# Patient Record
Sex: Male | Born: 1950 | Race: White | Hispanic: No | Marital: Single | Smoking: Never smoker
Health system: Southern US, Community
[De-identification: ages and names within clinical notes are randomized; demographics above are authoritative.]

## PROBLEM LIST (undated history)

## (undated) DIAGNOSIS — D496 Neoplasm of unspecified behavior of brain: Secondary | ICD-10-CM

## (undated) DIAGNOSIS — S069XAA Unspecified intracranial injury with loss of consciousness status unknown, initial encounter: Secondary | ICD-10-CM

## (undated) DIAGNOSIS — S069X9A Unspecified intracranial injury with loss of consciousness of unspecified duration, initial encounter: Secondary | ICD-10-CM

## (undated) DIAGNOSIS — D2360 Other benign neoplasm of skin of unspecified upper limb, including shoulder: Secondary | ICD-10-CM

## (undated) DIAGNOSIS — D126 Benign neoplasm of colon, unspecified: Secondary | ICD-10-CM

## (undated) DIAGNOSIS — C44519 Basal cell carcinoma of skin of other part of trunk: Secondary | ICD-10-CM

---

## 2008-09-07 LAB — GLUCOSE, POC: Glucose (POC): 141 mg/dL — ABNORMAL HIGH (ref 70–110)

## 2014-01-11 NOTE — Other (Signed)
Pt denies sleep apnea

## 2014-01-12 ENCOUNTER — Inpatient Hospital Stay: Payer: MEDICARE

## 2014-01-12 LAB — GLUCOSE, POC: Glucose (POC): 124 mg/dL — ABNORMAL HIGH (ref 70–110)

## 2014-01-12 MED ORDER — NALOXONE 0.4 MG/ML INJECTION
0.4 mg/mL | INTRAMUSCULAR | Status: DC | PRN
Start: 2014-01-12 — End: 2014-01-12

## 2014-01-12 MED ORDER — MIDAZOLAM 1 MG/ML IJ SOLN
1 mg/mL | INTRAMUSCULAR | Status: DC | PRN
Start: 2014-01-12 — End: 2014-01-12
  Administered 2014-01-12: 15:00:00 via INTRAVENOUS

## 2014-01-12 MED ORDER — FLUMAZENIL 0.1 MG/ML IV SOLN
0.1 mg/mL | INTRAVENOUS | Status: DC | PRN
Start: 2014-01-12 — End: 2014-01-12

## 2014-01-12 MED ORDER — FENTANYL CITRATE (PF) 50 MCG/ML IJ SOLN
50 mcg/mL | INTRAMUSCULAR | Status: DC | PRN
Start: 2014-01-12 — End: 2014-01-12
  Administered 2014-01-12: 15:00:00 via INTRAVENOUS

## 2014-01-12 MED ADMIN — 0.9% sodium chloride infusion: INTRAVENOUS | @ 14:00:00 | NDC 00409798309

## 2014-01-12 MED FILL — SODIUM CHLORIDE 0.9 % IV: INTRAVENOUS | Qty: 1000

## 2014-01-12 MED FILL — MIDAZOLAM 1 MG/ML IJ SOLN: 1 mg/mL | INTRAMUSCULAR | Qty: 10

## 2014-01-12 MED FILL — FENTANYL CITRATE (PF) 50 MCG/ML IJ SOLN: 50 mcg/mL | INTRAMUSCULAR | Qty: 4

## 2014-01-12 NOTE — Procedures (Signed)
Enlarged prostate 1+. ASCENDING COLON MILD diverticulosis. Medium internal hemorrhoids.    2 sessile 5 and 4 mm benign looking polyps in descending colon and sigmoid cold snared

## 2014-11-22 ENCOUNTER — Encounter

## 2014-11-22 ENCOUNTER — Inpatient Hospital Stay: Admit: 2014-11-22 | Payer: MEDICARE | Primary: Family Medicine

## 2014-11-22 DIAGNOSIS — Z01818 Encounter for other preprocedural examination: Secondary | ICD-10-CM

## 2014-11-22 LAB — METABOLIC PANEL, BASIC
Anion gap: 8 mmol/L (ref 3.0–18)
BUN/Creatinine ratio: 15 (ref 12–20)
BUN: 15 MG/DL (ref 7.0–18)
CO2: 28 mmol/L (ref 21–32)
Calcium: 8.8 MG/DL (ref 8.5–10.1)
Chloride: 101 mmol/L (ref 100–108)
Creatinine: 0.98 MG/DL (ref 0.6–1.3)
GFR est AA: >60 mL/min/{1.73_m2} (ref >60)
GFR est non-AA: >60 mL/min/{1.73_m2} (ref >60)
Glucose: 118 mg/dL — ABNORMAL HIGH (ref 74–99)
Potassium: 3.8 mmol/L (ref 3.5–5.5)
Sodium: 137 mmol/L (ref 136–145)

## 2014-11-22 LAB — HGB & HCT
HCT: 43.1 % (ref 36.0–48.0)
HGB: 15.4 g/dL (ref 13.0–16.0)

## 2014-11-22 LAB — HEMOGLOBIN A1C WITH EAG
Est. average glucose: 148 mg/dL
Hemoglobin A1c: 6.8 % — ABNORMAL HIGH (ref 4.5–5.6)

## 2014-11-22 NOTE — Other (Addendum)
Denies sleep apneaPatient states family physician is aware of upcoming procedure/surgeryDenies family history of anesthesia complicationsDenies shortness of breath nor chest pain while climbing stairs  To take hbp, seizure and cholesterol medication prior to arrival. Patient has a slow cognitive process with some memory retention indications requiring repeated instruction.  Last seizure activity unknown; pt feels that he never had one after the initial episode.  Requested last office note from Dr Earnie Larsson- neuro

## 2014-11-23 ENCOUNTER — Inpatient Hospital Stay: Payer: MEDICARE | Primary: Family Medicine

## 2014-11-23 LAB — EKG, 12 LEAD, INITIAL
Atrial Rate: 63 {beats}/min
Calculated P Axis: 63 degrees
Calculated R Axis: 5 degrees
Calculated T Axis: 96 degrees
Diagnosis: NORMAL
P-R Interval: 176 ms
Q-T Interval: 422 ms
QRS Duration: 88 ms
QTC Calculation (Bezet): 431 ms
Ventricular Rate: 63 {beats}/min

## 2014-11-29 NOTE — H&P (Signed)
Assessment/Plan  # Detail Type Description    1. Assessment Disorder of the skin and subcutaneous tissue, unspecified (L98.9).    Impression this is too large to excise in office, discussed surgical excision in OR, risks and benefits.    Patient Plan excise right upper back lesion                  This 64 year old male presents for Skin Lesion.      History of Present Illness:  1.  Skin Lesion   The patient presents with Skin Lesion that began gradually. The problem is moderate, worsened and occurs continuously. Area(s) of concern include the right upper back. The lesion(s) of concern is described as red color and growing. The patient has not been previously treated. The patient reports no prior history of skin cancer. Risk factors do not include family history of skin cancer.  Aggravating factors include clothing and scratching. Associated symptoms include a bloody lesion discharge and pigment change. The patient reports no change in size and shape of the mole(s), erythema or lymphadenopathy.                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   Completed with information received for patient in a summary of care record.     Medication Reconciliation  Medications reconciled today.  Medication Reviewed  Adherence Medication Name Sig Desc Elsewhere Status   taking as directed Janumet XR 50 mg-1,000 mg 24 hr Tab take 1 Tablet by oral route  every day with the evening meal, swallowing whole. Do not  crush, chew and/or divide. N Verified   taking as directed Keppra 750 mg Tab take 1 tablet (750MG )  by oral route 3 times every day N Verified   taking as directed Dilantin Kapseal 100 mg Cap take 3 Capsule (300MG )  by oral route  every day N Verified   taking as directed Zetia 10 mg tablet take 1 tablet (10MG )  by oral route  every day N Verified   taking as directed Ziac 10 mg-6.25 mg tablet take 2 tablet by ORAL route  every day N Verified   taking as directed lisinopril 40 mg tablet take 2 tablet (80MG )  by ORAL route  every day N Verified   taking as directed atorvastatin 80 mg tablet take 1 tablet by oral route  every day N Verified   taking as directed Norvasc 5 mg tablet take 1 tablet by oral route  every day N Verified     Allergies:  Ingredient Reaction Medication Name Comment   NO KNOWN ALLERGIES      (Reviewed, no changes.)  Review of Systems  System Neg/Pos Details   Constitutional Negative Fever, night sweats and weight loss.   ENMT Positive Hearing loss, Tinnitus.   ENMT Negative Vertigo and voice change.   Eyes Negative Diplopia and vision loss.   Respiratory Negative Asthma, cough, dyspnea, hemoptysis, known TB exposure and wheezing.  Cardio Negative Chest pain, claudication, edema, irregular heartbeat/palpitations and thrombophlebitis.   GI Negative Bloating, dysphagia, hemorrhoids, jaundice and reflux.   GU Positive Nocturia.   GU Negative Dysuria, passage stone/gravel and urinary incontinence.   Endocrine Negative Cold intolerance and goiter.   Neuro Negative Headache and syncope.   Integumentary Positive Lesion discharge, Pigment change.   Integumentary Negative Change in shape/size of mole(s), erythema and skin lesion.   MS Negative Back pain and bone/joint symptoms.   Hema/Lymph Negative Easy bleeding, easy bruising and lymphadenopathy.   Allergic/Immuno Negative Contact allergy and contact dermatitis.       Vital Signs     Height  Time ft in cm Last Measured Height Position    10:00 AM 6.0 0.00 182.88 11/20/2014 Standing     Weight/BSA/BMI  Time lb oz kg Context BMI kg/m2 BSA m2   10:00 AM 214.00  97.069 dressed with shoes 29.02      Blood Pressure  Time BP mm/Hg Position Side Site Method Cuff Size   10:00 AM 111/48 sitting right arm automatic adult     Temperature/Pulse/Respiration  Time Temp F Temp C Temp Site Pulse/min Pattern Resp/ min   10:00 AM 97.90 36.61 ear 61 regular      Measured By  Time Measured by   10:00 AM Charie White       Physical Exam:  Exam Findings Details   Constitutional * Overall appearance - older than stated age.   Constitutional Normal No acute distress. Well nourished.   Eyes Normal General - Right: Normal, Left: Normal. Lids/external - Right: Normal, Left: Normal. Conjunctiva - Right: Normal, Left: Normal. Sclera - Right: Normal, Left: Normal. Pupil - Right: Normal, Left: Normal.   Neck Exam Normal Inspection - Normal. Palpation - Normal. Parotid gland - Normal. Thyroid gland - Normal. Submandibular lymph nodes - Normal. Cervical lymph nodes - Normal.   Lymph Detail Normal Occipital. Postauricular. Preauricular. Submental. Submandibular. Parotid. Anterior cervical. Posterior cervical. Supraclavicular.   Respiratory Normal Cough - Absent. Effort - Normal.   Cardiovascular Normal Inspection - JVD: Absent. Heart rate - Regular rate. Rhythm - Regular.   Skin * Inspection - General inspection: warm and dry. Body areas examined - Back. Detailed inspection - Visual lesions: nodule(s), Color: red, Shape: round, Size: 1.2cm, Side: right, Location: back, upper.   Musculoskeletal Normal Gait - Normal.   Extremity Normal No Edema.   Neurological Normal Level of consciousness - Normal. Orientation - Normal. Memory - Normal.   Psychiatric Normal Orientation - Oriented to time, place, person & situation. No agitation. Not anxious. Appropriate mood and affect.         Medications (added, continued, or stopped this visit):  Started Medication Directions Instruction Stopped    10/23/2014 atorvastatin 80 mg tablet take 1 tablet by oral route  every day     02/13/2012 Dilantin Kapseal 100 mg Cap take 3 Capsule (300MG )  by oral route  every day     05/09/2011 Janumet XR 50 mg-1,000 mg 24 hr Tab take 1 Tablet by oral route  every day with the evening meal, swallowing whole. Do not crush, chew and/or divide.     09/15/2011 Keppra 750 mg Tab take 1 tablet (750MG )  by oral route 3 times every day     09/19/2014 lisinopril 40 mg tablet take 2 tablet (80MG )  by ORAL route  every day     10/23/2014 Norvasc 5 mg tablet take 1 tablet by oral route  every day  09/12/2014 Zetia 10 mg tablet take 1 tablet (10MG )  by oral route  every day     09/12/2014 Ziac 10 mg-6.25 mg tablet take 2 tablet by ORAL route  every day     Completed by:     Steva Colder 11/21/2014 7:21 AM   Document generated by:  Steva Colder 11/21/2014 07:21 AM     -----------------------------------------------------------------------------------------------------------

## 2014-11-30 ENCOUNTER — Inpatient Hospital Stay: Payer: MEDICARE

## 2014-11-30 LAB — GLUCOSE, POC
Glucose (POC): 199 mg/dL — ABNORMAL HIGH (ref 70–110)
Glucose (POC): 177 mg/dL — ABNORMAL HIGH (ref 70–110)

## 2014-11-30 MED ORDER — PROPOFOL 10 MG/ML IV EMUL
10 mg/mL | INTRAVENOUS | Status: DC | PRN
Start: 2014-11-30 — End: 2014-11-30
  Administered 2014-11-30 (×2): via INTRAVENOUS

## 2014-11-30 MED ORDER — BUPIVACAINE (PF) 0.5 % (5 MG/ML) IJ SOLN
0.5 % (5 mg/mL) | INTRAMUSCULAR | Status: DC | PRN
Start: 2014-11-30 — End: 2014-11-30
  Administered 2014-11-30: 14:00:00 via SUBCUTANEOUS

## 2014-11-30 MED ORDER — INSULIN LISPRO 100 UNIT/ML INJECTION
100 unit/mL | Freq: Once | SUBCUTANEOUS | Status: AC
Start: 2014-11-30 — End: 2014-11-30
  Administered 2014-11-30: 13:00:00 via SUBCUTANEOUS

## 2014-11-30 MED ORDER — FENTANYL CITRATE (PF) 50 MCG/ML IJ SOLN
50 mcg/mL | INTRAMUSCULAR | Status: DC | PRN
Start: 2014-11-30 — End: 2014-11-30
  Administered 2014-11-30 (×4): via INTRAVENOUS

## 2014-11-30 MED ORDER — HYDROCODONE-ACETAMINOPHEN 5 MG-325 MG TAB
5-325 mg | ORAL_TABLET | Freq: Three times a day (TID) | ORAL | Status: AC | PRN
Start: 2014-11-30 — End: ?

## 2014-11-30 MED ORDER — LIDOCAINE (PF) 20 MG/ML (2 %) IJ SOLN
20 mg/mL (2 %) | INTRAMUSCULAR | Status: DC | PRN
Start: 2014-11-30 — End: 2014-11-30
  Administered 2014-11-30: 14:00:00 via INTRAVENOUS

## 2014-11-30 MED ORDER — FENTANYL CITRATE (PF) 50 MCG/ML IJ SOLN
50 mcg/mL | INTRAMUSCULAR | Status: AC
Start: 2014-11-30 — End: ?

## 2014-11-30 MED ORDER — LACTATED RINGERS IV
INTRAVENOUS | Status: DC
Start: 2014-11-30 — End: 2014-11-30
  Administered 2014-11-30: 12:00:00 via INTRAVENOUS

## 2014-11-30 MED ORDER — MIDAZOLAM 1 MG/ML IJ SOLN
1 mg/mL | INTRAMUSCULAR | Status: DC | PRN
Start: 2014-11-30 — End: 2014-11-30
  Administered 2014-11-30: 13:00:00 via INTRAVENOUS

## 2014-11-30 MED ORDER — MIDAZOLAM 1 MG/ML IJ SOLN
1 mg/mL | INTRAMUSCULAR | Status: AC
Start: 2014-11-30 — End: ?

## 2014-11-30 MED FILL — PROPOFOL 10 MG/ML IV EMUL: 10 mg/mL | INTRAVENOUS | Qty: 10

## 2014-11-30 MED FILL — LACTATED RINGERS IV: INTRAVENOUS | Qty: 600

## 2014-11-30 MED FILL — FENTANYL CITRATE (PF) 50 MCG/ML IJ SOLN: 50 mcg/mL | INTRAMUSCULAR | Qty: 2

## 2014-11-30 MED FILL — XYLOCAINE-MPF 20 MG/ML (2 %) INJECTION SOLUTION: 20 mg/mL (2 %) | INTRAMUSCULAR | Qty: 2

## 2014-11-30 MED FILL — MIDAZOLAM 1 MG/ML IJ SOLN: 1 mg/mL | INTRAMUSCULAR | Qty: 5

## 2014-11-30 MED FILL — LACTATED RINGERS IV: INTRAVENOUS | Qty: 1000

## 2014-11-30 MED FILL — HUMALOG U-100 INSULIN 100 UNIT/ML SUBCUTANEOUS SOLUTION: 100 unit/mL | SUBCUTANEOUS | Qty: 3

## 2014-11-30 NOTE — Op Note (Signed)
[place]                              [address]                          [facility] [place] [address]                               OPERATIVE REPORT    PATIENT:       Thomas Klein, Thomas Klein  MRN:              [id number]  DATE:             11/30/2014  BILLING:          [id number] LOCATION:         [place]  PH  PL  DICTATING:     Alphonzo Cruise, MD        PREOPERATIVE DIAGNOSIS: Right upper back skin lesion.    POSTOPERATIVE DIAGNOSIS: Right upper back skin lesion.    PROCEDURES PERFORMED: Wide local excision of right upper back skin lesion.    ATTENDING SURGEON: Alphonzo Cruise, MD    ANESTHESIA: MAC.    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 He  is brought to the operating room for wide local excision.    ESTIMATED BLOOD LOSS: 3 mL.    SPECIMENS REMOVED: Right upper back skin lesion.    DESCRIPTION OF PROCEDURE: The patient was brought in the operating room,  placed on the table in the left lateral decubitus position. After placing  monitors and adequate IV sedation, the upper back was prepped and draped in  the usual sterile fashion. Marcaine was injected locally in the skin and  subcutaneous tissue. An elliptical incision was made in a transverse  orientation through the skin down to subcutaneous tissue. Then, using  electrocautery dissection, healthy fibroadipose tissue was divided. The  specimen was marked with a suture at the lateral portion of the specimen.  It was sent to Pathology for permanent sectioning. The subcutaneous tissue  was mobilized with the electrocautery. Subcutaneous tissue and dermis were  closed with interrupted 3-0 Monocryl suture and a running 3-0 Monocryl  suture was used to reapproximate the skin. Steri-Strips were applied and  the wound was dressed sterilely. The sponge, instrument, and needle count   was correct at the end of the procedure.                     Alphonzo Cruise, MD      EAO:wmx  D: 11/30/2014 10:06 A   T: 11/30/2014 10:34 A  BY:  [id number]  Job#:  [id number]  CScriptDoc #:  [id number]    cc:   Allison Quarry, MD        Alphonzo Cruise, MD

## 2014-11-30 NOTE — Interval H&P Note (Signed)
H&P Update:  Thomas Klein was seen and examined.  History and physical has been reviewed. The patient has been examined. There have been no significant clinical changes since the completion of the originally dated History and Physical.  Patient identified by surgeon; surgical site was confirmed by patient and surgeon.    Signed By: Daniel Nones, MD     Nov 30, 2014 9:13 AM

## 2014-11-30 NOTE — Anesthesia Post-Procedure Evaluation (Signed)
Post-Anesthesia Evaluation and Assessment    Cardiovascular Function/Vital Signs  Visit Vitals   Item Reading   ??? BP 125/78 mmHg   ??? Pulse 55   ??? Temp 36.6 ??C (97.8 ??F)   ??? Resp 18   ??? Ht 5' 11"  (1.803 m)   ??? Wt 96.758 kg (213 lb 5 oz)   ??? BMI 29.76 kg/m2   ??? SpO2 99%       Patient is status post Procedure(s):  EXCISION RIGHT SHOULDER MASS.    Nausea/Vomiting: Controlled.    Postoperative hydration reviewed and adequate.    Pain:  Pain Scale 1: Numeric (0 - 10) (11/30/14 1117)  Pain Intensity 1: 0 (11/30/14 1117)   Managed.    Neurological Status:   Neuro (WDL): Exceptions to WDL (seizures approx 10 years ago) (11/30/14 1005)   At baseline.    Mental Status and Level of Consciousness: Arousable.    Pulmonary Status:   O2 Device: Room air (11/30/14 1046)   Adequate oxygenation and airway patent.    Complications related to anesthesia: None    Post-anesthesia assessment completed. No concerns.    Patient has met all discharge requirements.    Signed By: Kristeen Miss, MD    Nov 30, 2014

## 2014-11-30 NOTE — Op Note (Signed)
[place]                              [address]                          [facility] [place] [address]                               OPERATIVE REPORT    PATIENT:       Thomas Klein, Thomas Klein  MRN:              [id number]  DATE:             11/30/2014  BILLING:          [id number] LOCATION:         [place]  PH  PL  DICTATING:     Alphonzo Cruise, MD        PREOPERATIVE DIAGNOSIS: Right upper back skin lesion.    POSTOPERATIVE DIAGNOSIS: Right upper back skin lesion.    PROCEDURES PERFORMED: Wide local excision of right upper back skin lesion.    ATTENDING SURGEON: Alphonzo Cruise, MD    ANESTHESIA: MAC.    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 He  is brought to the operating room for wide local excision.    ESTIMATED BLOOD LOSS: 3 mL.    SPECIMENS REMOVED: Right upper back skin lesion.    DESCRIPTION OF PROCEDURE: The patient was brought in the operating room,  placed on the table in the left lateral decubitus position. After placing  monitors and adequate IV sedation, the upper back was prepped and draped in  the usual sterile fashion. Marcaine was injected locally in the skin and  subcutaneous tissue. An elliptical incision was made in a transverse  orientation through the skin down to subcutaneous tissue. Then, using  electrocautery dissection, healthy fibroadipose tissue was divided. The  specimen was marked with a suture at the lateral portion of the specimen.  It was sent to Pathology for permanent sectioning. The subcutaneous tissue  was mobilized with the electrocautery. Subcutaneous tissue and dermis were  closed with interrupted 3-0 Monocryl suture and a running 3-0 Monocryl  suture was used to reapproximate the skin. Steri-Strips were applied and  the wound was dressed sterilely. The sponge, instrument, and needle count  was correct at the end  of the procedure.                     Alphonzo Cruise, MD      EAO:wmx  D: 11/30/2014 10:06 A   T: 11/30/2014 10:34 A  BY:  [id number]  Job#:  [id number]  CScriptDoc #:  [id number]    cc:   Allison Quarry, MD        Alphonzo Cruise, MD

## 2014-11-30 NOTE — Anesthesia Pre-Procedure Evaluation (Signed)
Anesthetic History   No history of anesthetic complications            Review of Systems / Medical History  Patient summary reviewed, nursing notes reviewed and pertinent labs reviewed    Pulmonary                   Neuro/Psych     seizures: well controlled    Psychiatric history (depression)     Cardiovascular    Hypertension: well controlled              Exercise tolerance: >4 METS     GI/Hepatic/Renal                Endo/Other    Diabetes: well controlled    Arthritis     Other Findings              Physical Exam    Airway  Mallampati: II  TM Distance: 4 - 6 cm  Neck ROM: normal range of motion   Mouth opening: Normal     Cardiovascular  Regular rate and rhythm,  S1 and S2 normal,  no murmur, click, rub, or gallop  Rhythm: regular  Rate: normal         Dental  No notable dental hx       Pulmonary  Breath sounds clear to auscultation               Abdominal  GI exam deferred       Other Findings            Anesthetic Plan    ASA: 2  Anesthesia type: MAC          Induction: Intravenous  Anesthetic plan and risks discussed with: Patient

## 2014-11-30 NOTE — Other (Signed)
Pt received 3 units Humalog per protocol FBS 199

## 2017-11-07 ENCOUNTER — Encounter (HOSPITAL_COMMUNITY): Payer: Self-pay

## 2017-11-07 ENCOUNTER — Other Ambulatory Visit: Payer: Self-pay

## 2017-11-07 ENCOUNTER — Emergency Department (HOSPITAL_COMMUNITY)
Admission: EM | Admit: 2017-11-07 | Discharge: 2017-11-08 | Disposition: A | Discharge status: Other Acute Inpatient Hospital | Payer: Medicare PPO | Attending: Emergency Medicine | Admitting: Emergency Medicine

## 2017-11-07 DIAGNOSIS — Z0489 Encounter for examination and observation for other specified reasons: Secondary | ICD-10-CM | POA: Diagnosis present

## 2017-11-07 DIAGNOSIS — Z87820 Personal history of traumatic brain injury: Secondary | ICD-10-CM | POA: Insufficient documentation

## 2017-11-07 DIAGNOSIS — F0281 Dementia in other diseases classified elsewhere with behavioral disturbance: Secondary | ICD-10-CM

## 2017-11-07 DIAGNOSIS — Z79899 Other long term (current) drug therapy: Secondary | ICD-10-CM | POA: Diagnosis not present

## 2017-11-07 DIAGNOSIS — H5712 Ocular pain, left eye: Secondary | ICD-10-CM | POA: Insufficient documentation

## 2017-11-07 DIAGNOSIS — R451 Restlessness and agitation: Secondary | ICD-10-CM | POA: Insufficient documentation

## 2017-11-07 DIAGNOSIS — S069X9S Unspecified intracranial injury with loss of consciousness of unspecified duration, sequela: Secondary | ICD-10-CM

## 2017-11-07 DIAGNOSIS — F068 Other specified mental disorders due to known physiological condition: Secondary | ICD-10-CM | POA: Insufficient documentation

## 2017-11-07 DIAGNOSIS — S069XAS Unspecified intracranial injury with loss of consciousness status unknown, sequela: Secondary | ICD-10-CM

## 2017-11-07 DIAGNOSIS — F02A18 Dementia in other diseases classified elsewhere, mild, with other behavioral disturbance: Secondary | ICD-10-CM

## 2017-11-07 HISTORY — DX: Unspecified intracranial injury with loss of consciousness of unspecified duration, initial encounter: S06.9X9A

## 2017-11-07 HISTORY — DX: Unspecified intracranial injury with loss of consciousness status unknown, initial encounter: S06.9XAA

## 2017-11-07 HISTORY — DX: Neoplasm of unspecified behavior of brain: D49.6

## 2017-11-07 LAB — CBC WITH DIFFERENTIAL/PLATELET
Basophils Absolute: 0 10*3/uL (ref 0.0–0.1)
Basophils Relative: 0 %
EOS ABS: 0.2 10*3/uL (ref 0.0–0.7)
Eosinophils Relative: 2 %
HCT: 45.9 % (ref 39.0–52.0)
HEMOGLOBIN: 15.7 g/dL (ref 13.0–17.0)
LYMPHS ABS: 2.5 10*3/uL (ref 0.7–4.0)
LYMPHS PCT: 27 %
MCH: 31.3 pg (ref 26.0–34.0)
MCHC: 34.2 g/dL (ref 30.0–36.0)
MCV: 91.6 fL (ref 78.0–100.0)
MONOS PCT: 8 %
Monocytes Absolute: 0.8 10*3/uL (ref 0.1–1.0)
NEUTROS PCT: 63 %
Neutro Abs: 5.9 10*3/uL (ref 1.7–7.7)
Platelets: 278 10*3/uL (ref 150–400)
RBC: 5.01 MIL/uL (ref 4.22–5.81)
RDW: 12.6 % (ref 11.5–15.5)
WBC: 9.4 10*3/uL (ref 4.0–10.5)

## 2017-11-07 LAB — RAPID URINE DRUG SCREEN, HOSP PERFORMED
AMPHETAMINES: NOT DETECTED
BENZODIAZEPINES: POSITIVE — AB
Barbiturates: NOT DETECTED
Cocaine: NOT DETECTED
OPIATES: NOT DETECTED
Tetrahydrocannabinol: NOT DETECTED

## 2017-11-07 LAB — COMPREHENSIVE METABOLIC PANEL
ALBUMIN: 4 g/dL (ref 3.5–5.0)
ALK PHOS: 101 U/L (ref 38–126)
ALT: 26 U/L (ref 17–63)
ANION GAP: 9 (ref 5–15)
AST: 23 U/L (ref 15–41)
BUN: 15 mg/dL (ref 6–20)
CHLORIDE: 102 mmol/L (ref 101–111)
CO2: 30 mmol/L (ref 22–32)
Calcium: 9.2 mg/dL (ref 8.9–10.3)
Creatinine, Ser: 0.75 mg/dL (ref 0.61–1.24)
GFR calc Af Amer: >60 mL/min (ref >60)
GFR calc non Af Amer: >60 mL/min (ref >60)
Glucose, Bld: 121 mg/dL — ABNORMAL HIGH (ref 65–99)
Potassium: 4.4 mmol/L (ref 3.5–5.1)
SODIUM: 141 mmol/L (ref 135–145)
TOTAL PROTEIN: 7.1 g/dL (ref 6.5–8.1)
Total Bilirubin: 0.5 mg/dL (ref 0.3–1.2)

## 2017-11-07 LAB — ETHANOL

## 2017-11-07 LAB — ACETAMINOPHEN LEVEL

## 2017-11-07 LAB — SALICYLATE LEVEL: Salicylate Lvl: <7 mg/dL (ref 2.8–30.0)

## 2017-11-07 MED ORDER — TETRACAINE HCL 0.5 % OP SOLN
1.0000 [drp] | Freq: Once | OPHTHALMIC | Status: AC
  Administered 2017-11-07: 1 [drp] via OPHTHALMIC
  Filled 2017-11-07: qty 4

## 2017-11-07 MED ORDER — LISINOPRIL 10 MG PO TABS
10.0000 mg | ORAL_TABLET | Freq: Every day | ORAL | Status: DC
  Administered 2017-11-08: 10 mg via ORAL
  Filled 2017-11-07: qty 1

## 2017-11-07 MED ORDER — POLYETHYLENE GLYCOL 3350 17 G PO PACK
17.0000 g | PACK | Freq: Every day | ORAL | Status: DC
  Administered 2017-11-08: 17 g via ORAL
  Filled 2017-11-07: qty 1

## 2017-11-07 MED ORDER — LEVETIRACETAM 750 MG PO TABS
750.0000 mg | ORAL_TABLET | Freq: Four times a day (QID) | ORAL | Status: DC
  Administered 2017-11-08: 750 mg via ORAL
  Filled 2017-11-07 (×3): qty 1

## 2017-11-07 MED ORDER — DIAZEPAM 2 MG PO TABS
1.0000 mg | ORAL_TABLET | Freq: Every day | ORAL | Status: DC
  Administered 2017-11-08: 1 mg via ORAL
  Filled 2017-11-07: qty 1

## 2017-11-07 MED ORDER — CITALOPRAM HYDROBROMIDE 10 MG PO TABS
20.0000 mg | ORAL_TABLET | Freq: Every day | ORAL | Status: DC
  Administered 2017-11-08: 20 mg via ORAL
  Filled 2017-11-07 (×2): qty 2

## 2017-11-07 MED ORDER — ATORVASTATIN CALCIUM 40 MG PO TABS: 40.0000 mg | ORAL_TABLET | Freq: Every day | ORAL | Status: DC

## 2017-11-07 MED ORDER — BUSPIRONE HCL 5 MG PO TABS
7.5000 mg | ORAL_TABLET | Freq: Two times a day (BID) | ORAL | Status: DC
  Administered 2017-11-08 (×2): 7.5 mg via ORAL
  Filled 2017-11-07 (×2): qty 1.5

## 2017-11-07 MED ORDER — ERYTHROMYCIN 5 MG/GM OP OINT
TOPICAL_OINTMENT | Freq: Four times a day (QID) | OPHTHALMIC | Status: DC
  Administered 2017-11-08: 11:00:00 via OPHTHALMIC
  Administered 2017-11-08: 1 via OPHTHALMIC
  Filled 2017-11-07 (×2): qty 3.5

## 2017-11-07 MED ORDER — FLUORESCEIN SODIUM 1 MG OP STRP
1.0000 | ORAL_STRIP | Freq: Once | OPHTHALMIC | Status: AC
Start: 2017-11-07 — End: 2017-11-07
  Administered 2017-11-07: 1 via OPHTHALMIC
  Filled 2017-11-07: qty 1
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
## 2017-11-07 NOTE — ED Notes (Signed)
Bed: WLPT4 Expected date:  Expected time:  Means of arrival:  Comments: 
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
## 2017-11-07 NOTE — ED Notes (Signed)
Patient calm and cooperative, no aggression since arrival to ED

## 2017-11-07 NOTE — ED Notes (Signed)
Patient is a fall risk and nurse gave report is aware.
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
## 2017-11-07 NOTE — ED Notes (Signed)
HCPOA-SUSAN:SISTER H [phone] Hector Patel [phone] PATIENT IS HARD OF HEARING PATIENT IS HIGH FALL RISK- HAS ARM BAND ON

## 2017-11-08 DIAGNOSIS — S069X9S Unspecified intracranial injury with loss of consciousness of unspecified duration, sequela: Secondary | ICD-10-CM

## 2017-11-08 DIAGNOSIS — F02A18 Dementia in other diseases classified elsewhere, mild, with other behavioral disturbance: Secondary | ICD-10-CM

## 2017-11-08 DIAGNOSIS — F0281 Dementia in other diseases classified elsewhere with behavioral disturbance: Secondary | ICD-10-CM

## 2017-11-08 DIAGNOSIS — S069XAS Unspecified intracranial injury with loss of consciousness status unknown, sequela: Secondary | ICD-10-CM

## 2017-11-08 MED ORDER — QUETIAPINE FUMARATE ER 50 MG PO TB24
50.0000 mg | ORAL_TABLET | Freq: Every day | ORAL | Status: DC
  Administered 2017-11-08: 50 mg via ORAL
  Filled 2017-11-08: qty 1

## 2017-11-08 MED ORDER — QUETIAPINE FUMARATE ER 50 MG PO TB24: 50.0000 mg | ORAL_TABLET | Freq: Every day | ORAL | 0 refills | Status: DC

## 2017-11-08 MED ORDER — LEVETIRACETAM 500 MG PO TABS
750.0000 mg | ORAL_TABLET | Freq: Four times a day (QID) | ORAL | Status: DC
  Administered 2017-11-08: 750 mg via ORAL
  Filled 2017-11-08 (×2): qty 1

## 2017-11-08 MED ORDER — ERYTHROMYCIN 5 MG/GM OP OINT: TOPICAL_OINTMENT | Freq: Four times a day (QID) | OPHTHALMIC | 0 refills | Status: DC
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
## 2017-11-08 NOTE — Progress Notes (Signed)
Per psychiatrist Akintayo and DNP Lord, patient psychiatrically cleared for discharge.   CSW contacted [facility] and notified staff member Legrand Como. Staff reported that he needed to speak with his executive director to confirm patient's ability to return. Staff agreed to contact CSW with an update.  [facility] present to transport patient. Patient's RN reported that she called [facility] after patient's discharge was completed. Per patient's RN, Patient's RN called report and report was accepted by staff. Staff member Tad Moore [facility]) informed patient's RN that they were waiting to hear back from Development worker, international aid on patient's ability to return. Patient's RN informed staff that patient's previous RN spoke with ALF executive director already today. Patient's RN reported that staff reported patient was okay to return if it was already cleared by [facility] Development worker, international aid.   CSW contacted patient's sister Juel Burrow [phone]) to provide update, no answer. CSW left voicemail requesting return call.  PTAR to transport patient back to ALF. CSW signing off, no other needs identified at this time.  Abundio Miu, [place] Social Worker [facility] Cell#: [phone]

## 2017-11-08 NOTE — ED Notes (Signed)
Patient assisted to the bathroom then put back in bed. Patient very cooperative. VSS. Patient eating breakfast at this time.
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
## 2017-11-08 NOTE — ED Notes (Signed)
Patient taken to restroom and put back to bed. Patient very cooperative.

## 2017-11-08 NOTE — Progress Notes (Signed)
TTS consulted with Lindon Romp, NP who recommends continued observation and to be reassessed in the AM. EDP. Dr. Florina Ou, MD and pt's nurse Markus Daft, RN has been advised of the disposition.  Lind Covert, MSW, LCSW Therapeutic Triage Specialist  [phone]

## 2017-11-08 NOTE — ED Notes (Signed)
Report called to [facility], [facility] Department to [employer].  Previous RN Geophysicist/field seismologist spoke with [facility] executive director and notified her that the patient would be discharged this morning back to their care. [place] Licensed conveyancer told previous nurse the patient would need doctors medical clearance with documentation to be admitted back to the unit. Patient discharged with discharge instructions stating he has been medically cleared. VSS. PTAR to transport patient with his clothing, belongings, and wheelchair.

## 2018-03-03 ENCOUNTER — Encounter (HOSPITAL_COMMUNITY): Payer: Self-pay | Admitting: *Deleted

## 2018-03-03 ENCOUNTER — Other Ambulatory Visit: Payer: Self-pay

## 2018-03-03 ENCOUNTER — Emergency Department (HOSPITAL_COMMUNITY): Payer: Medicare PPO

## 2018-03-03 ENCOUNTER — Inpatient Hospital Stay (HOSPITAL_COMMUNITY)
Admission: EM | Admit: 2018-03-03 | Discharge: 2018-03-05 | DRG: 193 | Disposition: A | Discharge status: Nursing Home (Includes ICF) | Payer: Medicare PPO | Source: Skilled Nursing Facility | Attending: Internal Medicine | Admitting: Internal Medicine

## 2018-03-03 DIAGNOSIS — Z66 Do not resuscitate: Secondary | ICD-10-CM | POA: Diagnosis present

## 2018-03-03 DIAGNOSIS — Z993 Dependence on wheelchair: Secondary | ICD-10-CM

## 2018-03-03 DIAGNOSIS — R269 Unspecified abnormalities of gait and mobility: Secondary | ICD-10-CM | POA: Diagnosis present

## 2018-03-03 DIAGNOSIS — E785 Hyperlipidemia, unspecified: Secondary | ICD-10-CM | POA: Diagnosis present

## 2018-03-03 DIAGNOSIS — Z79899 Other long term (current) drug therapy: Secondary | ICD-10-CM

## 2018-03-03 DIAGNOSIS — J129 Viral pneumonia, unspecified: Secondary | ICD-10-CM | POA: Diagnosis not present

## 2018-03-03 DIAGNOSIS — R410 Disorientation, unspecified: Secondary | ICD-10-CM

## 2018-03-03 DIAGNOSIS — A419 Sepsis, unspecified organism: Secondary | ICD-10-CM

## 2018-03-03 DIAGNOSIS — G40509 Epileptic seizures related to external causes, not intractable, without status epilepticus: Secondary | ICD-10-CM | POA: Diagnosis present

## 2018-03-03 DIAGNOSIS — S069X9A Unspecified intracranial injury with loss of consciousness of unspecified duration, initial encounter: Secondary | ICD-10-CM | POA: Diagnosis present

## 2018-03-03 DIAGNOSIS — R509 Fever, unspecified: Secondary | ICD-10-CM

## 2018-03-03 DIAGNOSIS — G40909 Epilepsy, unspecified, not intractable, without status epilepticus: Secondary | ICD-10-CM

## 2018-03-03 DIAGNOSIS — G9341 Metabolic encephalopathy: Secondary | ICD-10-CM | POA: Diagnosis present

## 2018-03-03 DIAGNOSIS — J189 Pneumonia, unspecified organism: Secondary | ICD-10-CM | POA: Diagnosis present

## 2018-03-03 DIAGNOSIS — L89151 Pressure ulcer of sacral region, stage 1: Secondary | ICD-10-CM | POA: Diagnosis present

## 2018-03-03 DIAGNOSIS — E860 Dehydration: Secondary | ICD-10-CM | POA: Diagnosis present

## 2018-03-03 DIAGNOSIS — R4182 Altered mental status, unspecified: Secondary | ICD-10-CM | POA: Diagnosis not present

## 2018-03-03 DIAGNOSIS — I10 Essential (primary) hypertension: Secondary | ICD-10-CM | POA: Diagnosis present

## 2018-03-03 DIAGNOSIS — S069X9S Unspecified intracranial injury with loss of consciousness of unspecified duration, sequela: Secondary | ICD-10-CM

## 2018-03-03 DIAGNOSIS — S069XAA Unspecified intracranial injury with loss of consciousness status unknown, initial encounter: Secondary | ICD-10-CM | POA: Diagnosis present

## 2018-03-03 DIAGNOSIS — Y95 Nosocomial condition: Secondary | ICD-10-CM | POA: Diagnosis present

## 2018-03-03 LAB — I-STAT CG4 LACTIC ACID, ED: LACTIC ACID, VENOUS: 2.22 mmol/L — AB (ref 0.5–1.9)

## 2018-03-03 LAB — COMPREHENSIVE METABOLIC PANEL
ALBUMIN: 4.1 g/dL (ref 3.5–5.0)
ALK PHOS: 90 U/L (ref 38–126)
ALT: 19 U/L (ref 0–44)
ANION GAP: 10 (ref 5–15)
AST: 20 U/L (ref 15–41)
BUN: 13 mg/dL (ref 8–23)
CALCIUM: 9.1 mg/dL (ref 8.9–10.3)
CO2: 28 mmol/L (ref 22–32)
Chloride: 100 mmol/L (ref 98–111)
Creatinine, Ser: 0.98 mg/dL (ref 0.61–1.24)
GFR calc Af Amer: >60 mL/min (ref >60)
GFR calc non Af Amer: >60 mL/min (ref >60)
GLUCOSE: 119 mg/dL — AB (ref 70–99)
Potassium: 4 mmol/L (ref 3.5–5.1)
SODIUM: 138 mmol/L (ref 135–145)
Total Bilirubin: 0.6 mg/dL (ref 0.3–1.2)
Total Protein: 6.9 g/dL (ref 6.5–8.1)

## 2018-03-03 LAB — CBC
HEMATOCRIT: 47.6 % (ref 39.0–52.0)
HEMOGLOBIN: 16.2 g/dL (ref 13.0–17.0)
MCH: 31.6 pg (ref 26.0–34.0)
MCHC: 34 g/dL (ref 30.0–36.0)
MCV: 93 fL (ref 78.0–100.0)
Platelets: 251 10*3/uL (ref 150–400)
RBC: 5.12 MIL/uL (ref 4.22–5.81)
RDW: 12.5 % (ref 11.5–15.5)
WBC: 18.7 10*3/uL — ABNORMAL HIGH (ref 4.0–10.5)

## 2018-03-03 MED ORDER — SODIUM CHLORIDE 0.9 % IV BOLUS
1000.0000 mL | Freq: Once | INTRAVENOUS | Status: AC
  Administered 2018-03-04: 1000 mL via INTRAVENOUS

## 2018-03-03 MED ORDER — SODIUM CHLORIDE 0.9 % IV SOLN: INTRAVENOUS | Status: DC

## 2018-03-03 MED ORDER — METRONIDAZOLE IN NACL 5-0.79 MG/ML-% IV SOLN
500.0000 mg | Freq: Three times a day (TID) | INTRAVENOUS | Status: DC
  Administered 2018-03-04 – 2018-03-05 (×5): 500 mg via INTRAVENOUS
  Filled 2018-03-03 (×5): qty 100

## 2018-03-03 MED ORDER — DIAZEPAM 5 MG/ML IJ SOLN
2.5000 mg | Freq: Once | INTRAMUSCULAR | Status: AC
  Administered 2018-03-03: 2.5 mg via INTRAVENOUS
  Filled 2018-03-03: qty 2

## 2018-03-03 MED ORDER — SODIUM CHLORIDE 0.9 % IV SOLN
2.0000 g | Freq: Once | INTRAVENOUS | Status: AC
  Administered 2018-03-04: 2 g via INTRAVENOUS
  Filled 2018-03-03: qty 2

## 2018-03-03 MED ORDER — VANCOMYCIN HCL IN DEXTROSE 1-5 GM/200ML-% IV SOLN
1000.0000 mg | Freq: Once | INTRAVENOUS | Status: AC
  Administered 2018-03-04: 1000 mg via INTRAVENOUS
  Filled 2018-03-03: qty 200

## 2018-03-03 NOTE — ED Triage Notes (Signed)
Pt from [facility] for altered mental status. Pt was at his baseline at 10pm then became altered. Strong odor of urine noted
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
## 2018-03-04 ENCOUNTER — Other Ambulatory Visit: Payer: Self-pay

## 2018-03-04 ENCOUNTER — Emergency Department (HOSPITAL_COMMUNITY): Payer: Medicare PPO

## 2018-03-04 DIAGNOSIS — J129 Viral pneumonia, unspecified: Secondary | ICD-10-CM | POA: Diagnosis present

## 2018-03-04 DIAGNOSIS — J189 Pneumonia, unspecified organism: Secondary | ICD-10-CM | POA: Diagnosis present

## 2018-03-04 DIAGNOSIS — S069XAA Unspecified intracranial injury with loss of consciousness status unknown, initial encounter: Secondary | ICD-10-CM | POA: Diagnosis present

## 2018-03-04 DIAGNOSIS — R269 Unspecified abnormalities of gait and mobility: Secondary | ICD-10-CM | POA: Diagnosis present

## 2018-03-04 DIAGNOSIS — E785 Hyperlipidemia, unspecified: Secondary | ICD-10-CM | POA: Diagnosis present

## 2018-03-04 DIAGNOSIS — A419 Sepsis, unspecified organism: Secondary | ICD-10-CM | POA: Diagnosis present

## 2018-03-04 DIAGNOSIS — L89151 Pressure ulcer of sacral region, stage 1: Secondary | ICD-10-CM | POA: Diagnosis present

## 2018-03-04 DIAGNOSIS — E860 Dehydration: Secondary | ICD-10-CM | POA: Diagnosis present

## 2018-03-04 DIAGNOSIS — G9341 Metabolic encephalopathy: Secondary | ICD-10-CM | POA: Diagnosis present

## 2018-03-04 DIAGNOSIS — R4182 Altered mental status, unspecified: Secondary | ICD-10-CM | POA: Diagnosis present

## 2018-03-04 DIAGNOSIS — Z79899 Other long term (current) drug therapy: Secondary | ICD-10-CM | POA: Diagnosis not present

## 2018-03-04 DIAGNOSIS — G40909 Epilepsy, unspecified, not intractable, without status epilepticus: Secondary | ICD-10-CM

## 2018-03-04 DIAGNOSIS — I10 Essential (primary) hypertension: Secondary | ICD-10-CM | POA: Diagnosis present

## 2018-03-04 DIAGNOSIS — S069X9A Unspecified intracranial injury with loss of consciousness of unspecified duration, initial encounter: Secondary | ICD-10-CM | POA: Diagnosis present

## 2018-03-04 DIAGNOSIS — G40509 Epileptic seizures related to external causes, not intractable, without status epilepticus: Secondary | ICD-10-CM | POA: Diagnosis present

## 2018-03-04 DIAGNOSIS — Z66 Do not resuscitate: Secondary | ICD-10-CM | POA: Diagnosis present

## 2018-03-04 DIAGNOSIS — Z993 Dependence on wheelchair: Secondary | ICD-10-CM | POA: Diagnosis not present

## 2018-03-04 DIAGNOSIS — R410 Disorientation, unspecified: Secondary | ICD-10-CM | POA: Diagnosis not present

## 2018-03-04 DIAGNOSIS — S069X9S Unspecified intracranial injury with loss of consciousness of unspecified duration, sequela: Secondary | ICD-10-CM | POA: Diagnosis not present

## 2018-03-04 DIAGNOSIS — Y95 Nosocomial condition: Secondary | ICD-10-CM | POA: Diagnosis present

## 2018-03-04 LAB — COMPREHENSIVE METABOLIC PANEL
ALBUMIN: 3.5 g/dL (ref 3.5–5.0)
ALT: 16 U/L (ref 0–44)
AST: 16 U/L (ref 15–41)
Alkaline Phosphatase: 81 U/L (ref 38–126)
Anion gap: 7 (ref 5–15)
BILIRUBIN TOTAL: 0.8 mg/dL (ref 0.3–1.2)
BUN: 11 mg/dL (ref 8–23)
CALCIUM: 8.6 mg/dL — AB (ref 8.9–10.3)
CO2: 28 mmol/L (ref 22–32)
Chloride: 105 mmol/L (ref 98–111)
Creatinine, Ser: 0.85 mg/dL (ref 0.61–1.24)
GFR calc Af Amer: >60 mL/min (ref >60)
GFR calc non Af Amer: >60 mL/min (ref >60)
GLUCOSE: 125 mg/dL — AB (ref 70–99)
POTASSIUM: 3.6 mmol/L (ref 3.5–5.1)
Sodium: 140 mmol/L (ref 135–145)
TOTAL PROTEIN: 6 g/dL — AB (ref 6.5–8.1)

## 2018-03-04 LAB — URINALYSIS, ROUTINE W REFLEX MICROSCOPIC
BILIRUBIN URINE: NEGATIVE
Glucose, UA: NEGATIVE mg/dL
Hgb urine dipstick: NEGATIVE
KETONES UR: NEGATIVE mg/dL
LEUKOCYTES UA: NEGATIVE
NITRITE: NEGATIVE
PH: 5 (ref 5.0–8.0)
Protein, ur: NEGATIVE mg/dL
SPECIFIC GRAVITY, URINE: 1.02 (ref 1.005–1.030)

## 2018-03-04 LAB — MAGNESIUM: Magnesium: 1.6 mg/dL — ABNORMAL LOW (ref 1.7–2.4)

## 2018-03-04 LAB — I-STAT CG4 LACTIC ACID, ED: LACTIC ACID, VENOUS: 1.46 mmol/L (ref 0.5–1.9)

## 2018-03-04 LAB — CBC
HEMATOCRIT: 43.5 % (ref 39.0–52.0)
Hemoglobin: 14.7 g/dL (ref 13.0–17.0)
MCH: 31 pg (ref 26.0–34.0)
MCHC: 33.8 g/dL (ref 30.0–36.0)
MCV: 91.8 fL (ref 78.0–100.0)
Platelets: 206 10*3/uL (ref 150–400)
RBC: 4.74 MIL/uL (ref 4.22–5.81)
RDW: 12.4 % (ref 11.5–15.5)
WBC: 14.7 10*3/uL — ABNORMAL HIGH (ref 4.0–10.5)

## 2018-03-04 LAB — PROCALCITONIN: Procalcitonin: <0.1 ng/mL

## 2018-03-04 LAB — TSH: TSH: 1.3 u[IU]/mL (ref 0.350–4.500)

## 2018-03-04 LAB — HIV ANTIBODY (ROUTINE TESTING W REFLEX): HIV Screen 4th Generation wRfx: NONREACTIVE

## 2018-03-04 LAB — LACTIC ACID, PLASMA
Lactic Acid, Venous: 1.6 mmol/L (ref 0.5–1.9)
Lactic Acid, Venous: 1.4 mmol/L (ref 0.5–1.9)

## 2018-03-04 LAB — STREP PNEUMONIAE URINARY ANTIGEN: Strep Pneumo Urinary Antigen: NEGATIVE

## 2018-03-04 LAB — PHOSPHORUS: Phosphorus: 2.9 mg/dL (ref 2.5–4.6)

## 2018-03-04 LAB — MRSA PCR SCREENING: MRSA by PCR: NEGATIVE

## 2018-03-04 MED ORDER — ATORVASTATIN CALCIUM 40 MG PO TABS
40.0000 mg | ORAL_TABLET | Freq: Every day | ORAL | Status: DC
  Administered 2018-03-04 – 2018-03-05 (×2): 40 mg via ORAL
  Filled 2018-03-04 (×2): qty 1

## 2018-03-04 MED ORDER — SODIUM CHLORIDE 0.9 % IV SOLN
1.0000 g | Freq: Three times a day (TID) | INTRAVENOUS | Status: DC
  Administered 2018-03-04 – 2018-03-05 (×4): 1 g via INTRAVENOUS
  Filled 2018-03-04 (×5): qty 1

## 2018-03-04 MED ORDER — ONDANSETRON HCL 4 MG/2ML IJ SOLN: 4.0000 mg | Freq: Four times a day (QID) | INTRAMUSCULAR | Status: DC | PRN

## 2018-03-04 MED ORDER — LISINOPRIL 10 MG PO TABS
10.0000 mg | ORAL_TABLET | Freq: Every day | ORAL | Status: DC
  Administered 2018-03-04 – 2018-03-05 (×2): 10 mg via ORAL
  Filled 2018-03-04 (×2): qty 1

## 2018-03-04 MED ORDER — DIAZEPAM 5 MG/ML IJ SOLN
2.5000 mg | Freq: Once | INTRAMUSCULAR | Status: AC
  Administered 2018-03-04: 2.5 mg via INTRAVENOUS

## 2018-03-04 MED ORDER — ERYTHROMYCIN 5 MG/GM OP OINT
TOPICAL_OINTMENT | Freq: Four times a day (QID) | OPHTHALMIC | Status: DC
  Administered 2018-03-05: 1 via OPHTHALMIC
  Administered 2018-03-05: 07:00:00 via OPHTHALMIC
  Filled 2018-03-04: qty 3.5

## 2018-03-04 MED ORDER — MAGNESIUM SULFATE 2 GM/50ML IV SOLN
2.0000 g | Freq: Once | INTRAVENOUS | Status: AC
  Administered 2018-03-04: 2 g via INTRAVENOUS
  Filled 2018-03-04: qty 50

## 2018-03-04 MED ORDER — ALBUTEROL SULFATE (2.5 MG/3ML) 0.083% IN NEBU: 2.5000 mg | INHALATION_SOLUTION | RESPIRATORY_TRACT | Status: DC | PRN

## 2018-03-04 MED ORDER — ACETAMINOPHEN 325 MG PO TABS: 650.0000 mg | ORAL_TABLET | Freq: Four times a day (QID) | ORAL | Status: DC | PRN

## 2018-03-04 MED ORDER — ONDANSETRON HCL 4 MG PO TABS: 4.0000 mg | ORAL_TABLET | Freq: Four times a day (QID) | ORAL | Status: DC | PRN

## 2018-03-04 MED ORDER — POLYETHYLENE GLYCOL 3350 17 G PO PACK: 17.0000 g | PACK | Freq: Every day | ORAL | Status: DC | PRN

## 2018-03-04 MED ORDER — LEVETIRACETAM ER 500 MG PO TB24
1500.0000 mg | ORAL_TABLET | Freq: Two times a day (BID) | ORAL | Status: DC
  Administered 2018-03-04 – 2018-03-05 (×3): 1500 mg via ORAL
  Filled 2018-03-04 (×6): qty 3

## 2018-03-04 MED ORDER — SODIUM CHLORIDE 0.9 % IV SOLN
INTRAVENOUS | Status: AC
  Administered 2018-03-04: 03:00:00 via INTRAVENOUS

## 2018-03-04 MED ORDER — ENOXAPARIN SODIUM 40 MG/0.4ML ~~LOC~~ SOLN
40.0000 mg | SUBCUTANEOUS | Status: DC
  Filled 2018-03-04 (×2): qty 0.4

## 2018-03-04 MED ORDER — GUAIFENESIN ER 600 MG PO TB12
600.0000 mg | ORAL_TABLET | Freq: Two times a day (BID) | ORAL | Status: DC
  Administered 2018-03-04 – 2018-03-05 (×2): 600 mg via ORAL
  Filled 2018-03-04 (×4): qty 1

## 2018-03-04 MED ORDER — VANCOMYCIN HCL IN DEXTROSE 1-5 GM/200ML-% IV SOLN
1000.0000 mg | Freq: Two times a day (BID) | INTRAVENOUS | Status: DC
  Filled 2018-03-04: qty 200

## 2018-03-04 MED ORDER — CITALOPRAM HYDROBROMIDE 10 MG PO TABS
20.0000 mg | ORAL_TABLET | Freq: Every day | ORAL | Status: DC
  Administered 2018-03-04 – 2018-03-05 (×2): 20 mg via ORAL
  Filled 2018-03-04 (×2): qty 2

## 2018-03-04 MED ORDER — QUETIAPINE FUMARATE ER 50 MG PO TB24
50.0000 mg | ORAL_TABLET | Freq: Every day | ORAL | Status: DC
  Administered 2018-03-04 – 2018-03-05 (×2): 50 mg via ORAL
  Filled 2018-03-04 (×3): qty 1

## 2018-03-04 MED ORDER — DIAZEPAM 5 MG/ML IJ SOLN: 2.5000 mg | Freq: Once | INTRAMUSCULAR | Status: DC

## 2018-03-04 MED ORDER — POLYETHYLENE GLYCOL 3350 17 G PO PACK
17.0000 g | PACK | Freq: Every day | ORAL | Status: DC
  Administered 2018-03-04 – 2018-03-05 (×2): 17 g via ORAL
  Filled 2018-03-04 (×2): qty 1

## 2018-03-04 MED ORDER — BUSPIRONE HCL 15 MG PO TABS
7.5000 mg | ORAL_TABLET | Freq: Two times a day (BID) | ORAL | Status: DC
  Administered 2018-03-04 – 2018-03-05 (×3): 7.5 mg via ORAL
  Filled 2018-03-04 (×4): qty 1

## 2018-03-04 MED ORDER — ACETAMINOPHEN 650 MG RE SUPP: 650.0000 mg | Freq: Four times a day (QID) | RECTAL | Status: DC | PRN

## 2018-03-04 MED ORDER — DIAZEPAM 2 MG PO TABS
1.0000 mg | ORAL_TABLET | Freq: Every day | ORAL | Status: DC
  Administered 2018-03-04: 1 mg via ORAL
  Filled 2018-03-04: qty 1

## 2018-03-04 NOTE — Evaluation (Signed)
SLP Cancellation Note  Patient Details Name: Hector Patel MRN: [id number] DOB: [birth year]   Cancelled treatment:       Reason Eval/Treat Not Completed: Other (comment)(pt currently sound asleep and has h/o agitation, will continue efforts)   Macario Golds 03/04/2018, 8:45 AM

## 2018-03-04 NOTE — H&P (Signed)
Constant Mandeville HYQ:[id number] DOB: Jul 08, 1950 DOA: 03/03/2018     PCP: Patient, No Pcp Per   Outpatient Specialists:   NONE   Patient arrived to ER on 03/03/18 at 2252  Patient coming from:    From facility [facility] in the memory care   Chief Complaint:  Chief Complaint  Patient presents with  . Altered Mental Status    HPI: Hector Patel is a 67 y.o. male with medical history significant of  brain tumor and TBI, HLD, HTN, seizures    Presented with acute change in mental status was at baseline at 10 PM and became confused. Pt unable to provide hx. Strong smelling urine. Per staff no history of seizures recently She has history of violent outbursts and has been taking Valium.  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 Blood Culture (routine x 2)  . CT HEAD WO CONTRAST  . DG Chest Port 1 View  . Comprehensive metabolic panel  . CBC  . Urinalysis, Routine w reflex microscopic  . Diet NPO time specified  . Cardiac monitoring  . Saline Lock IV, Maintain IV access  . Initiate Carrier Fluid Protocol  . Refer to Sidebar Report for: Sepsis Bundle ED/IP  . Document vital signs within 1-hour of fluid bolus completion and notify provider of bolus completion  . Document Actual / Estimated Weight  . Call Code Sepsis ([facility] [phone]) Reason for Consult? tracking  . pharmacy consult  . ceFEPime (MAXIPIME) per pharmacy consult  . vancomycin per pharmacy consult  . Consult for [facility] Admission  . Pulse oximetry, continuous  . CBG monitoring, ED  . I-Stat CG4 Lactic Acid, ED  . ED EKG 12-Lead  . EKG  12-Lead  . EKG 12-Lead  . Insert peripheral IV   Following Medications were ordered in ER: Medications  sodium chloride 0.9 % bolus 1,000 mL (1,000 mLs Intravenous New Bag/Given 03/04/18 0001)    And  0.9 %  sodium chloride infusion (has no administration in time range)  ceFEPIme (MAXIPIME) 2 g in sodium chloride 0.9 % 100 mL IVPB (2 g Intravenous New Bag/Given 03/04/18 0037)  metroNIDAZOLE (FLAGYL) IVPB 500 mg (has no administration in time range)  vancomycin (VANCOCIN) IVPB 1000 mg/200 mL premix (has no administration in time range)  diazepam (VALIUM) injection 2.5 mg (2.5 mg Intravenous Given 03/03/18 2359)    Significant initial  Findings: Abnormal Labs Reviewed  COMPREHENSIVE METABOLIC PANEL - Abnormal; Notable for the following components:      Result Value   Glucose, Bld 119 (*)    All other components within normal limits  CBC - Abnormal; Notable for the following components:   WBC 18.7 (*)    All other components within normal limits  I-STAT CG4 LACTIC ACID, ED - Abnormal; Notable for the following components:   Lactic Acid, Venous 2.22 (*)    All other components within normal limits   Na 138 K 4.0  Cr   Up from baseline see below Lab Results  Component Value Date   CREATININE 0.98 03/03/2018   CREATININE 0.75 11/07/2017      WBC  18.7  HG/HCT Up from baseline see below    Component Value Date/Time   HGB 16.2 03/03/2018 2308   HCT 47.6 03/03/2018 2308    Lactic Acid, Venous    Component Value Date/Time   LATICACIDVEN 2.22 (HH) 03/03/2018 2330     UA   ordered   CT HEAD - NON acute.  Possible mastoiditis but not confirmed on physical exam  CXR - Left lower lobe opacity concerning for pneumonia    ECG:  Personally reviewed by me showing: HR : 87 Rhythm:  NSR,    no evidence of ischemic changes QTC 464       ED Triage Vitals  Enc Vitals Group     BP 03/03/18 2300 134/83     Pulse Rate 03/03/18 2300 91     Resp 03/03/18 2300 16     Temp  03/03/18 2300 99.9 F (37.7 C)     Temp Source 03/03/18 2300 Oral     SpO2 03/03/18 2300 98 %     Weight --      Height --      Head Circumference --      Peak Flow --      Pain Score 03/03/18 2301 0     Pain Loc --      Pain Edu? --      Excl. in [place]? --   TMAX(24)@       Latest  Blood pressure 136/83, pulse 92, temperature 99.9 F (37.7 C), temperature source Oral, resp. rate (!) 23, SpO2 99 %.    Hospitalist was called for admission for sepsis secondary to left lower lobe pneumonia   Review of Systems:    Pertinent positives include: Fevers, chills, change in color of urine, foul order  confusion   Constitutional:  No weight loss, night sweats, fatigue, weight loss  HEENT:  No headaches, Difficulty swallowing,Tooth/dental problems,Sore throat,  No sneezing, itching, ear ache, nasal congestion, post nasal drip,  Cardio-vascular:  No chest pain, Orthopnea, PND, anasarca, dizziness, palpitations.no Bilateral lower extremity swelling  GI:  No heartburn, indigestion, abdominal pain, nausea, vomiting, diarrhea, change in bowel habits, loss of appetite, melena, blood in stool, hematemesis Resp:  no shortness of breath at rest. No dyspnea on exertion, No excess mucus, no productive cough, No non-productive cough, No coughing up of blood.No change in color of mucus.No wheezing. Skin:  no rash or lesions. No jaundice GU:  no dysuria, no urgency or frequency. No straining to urinate.  No flank pain.  Musculoskeletal:  No joint pain or no joint swelling. No decreased range of motion. No back pain.  Psych:  No change in mood or affect. No depression or anxiety. No memory loss.  Neuro: no localizing neurological complaints, no tingling, no weakness, no double vision, no gait abnormality, no slurred speech, no All systems reviewed and apart from [place] all are negative  Past Medical History:   Past Medical History:  Diagnosis Date  . Brain injury ([place])   . Brain tumor [facility])         History reviewed. No pertinent surgical history.  Social History:  Ambulatory  independently     reports that he has never smoked. He does not have any smokeless tobacco history on file. His alcohol and drug histories are not on file.     Family History: Unable to obtain secondary to patient's confusion  No family at bedside  Allergies: No Known Allergies   Prior to Admission medications   Medication  Sig Start Date End Date Taking? Authorizing Provider  atorvastatin (LIPITOR) 40 MG tablet Take 40 mg by mouth daily.    [provider]  busPIRone (BUSPAR) 7.5 MG tablet Take 7.5 mg by mouth 2 (two) times daily.    [provider]  citalopram (CELEXA) 20 MG tablet Take 20 mg by mouth daily.    [provider]  diazepam (VALIUM) 2 MG tablet Take 1 mg by mouth at bedtime.    [provider]  erythromycin ophthalmic ointment Place into the left eye every 6 (six) hours. 11/08/17   Patrecia Pour, NP  Levetiracetam 750 MG TB24 Take 750 mg by mouth 4 (four) times daily.    [provider]  lisinopril (PRINIVIL,ZESTRIL) 10 MG tablet Take 10 mg by mouth daily.    [provider]  polyethylene glycol (MIRALAX / GLYCOLAX) packet Take 17 g by mouth daily.    [provider]  QUEtiapine (SEROQUEL XR) 50 MG TB24 24 hr tablet Take 1 tablet (50 mg total) by mouth daily. 11/08/17   Patrecia Pour, NP   Physical Exam: Blood pressure 136/83, pulse 92, temperature 99.9 F (37.7 C), temperature source Oral, resp. rate (!) 23, SpO2 99 %. 1. General:  in No Acute distress   Chronically ill -appearing 2. Psychological: Alert and  Oriented to self 3. Head/ENT:      Dry Mucous Membranes                          Head Non traumatic, neck supple                           Poor Dentition 4. SKIN:  decreased Skin turgor,  Skin clean Dry and intact no rash 5. Heart: Regular rate and rhythm no  Murmur, no Rub or gallop 6. Lungs:   no wheezes  some  crackles   7. Abdomen: Soft,  non-tender, Non distended bowel sounds present 8. Lower extremities: no clubbing, cyanosis, or  edema 9. Neurologically Grossly intact, moving all 4 extremities equally   10. MSK: Normal range of motion   LABS:     Recent Labs  Lab 03/03/18 2308  WBC 18.7*  HGB 16.2  HCT 47.6  MCV 93.0  PLT 469   Basic Metabolic Panel: Recent Labs  Lab 03/03/18 2308  NA 138  K 4.0  CL 100  CO2 28  GLUCOSE 119*  BUN 13  CREATININE 0.98  CALCIUM 9.1      Recent Labs  Lab 03/03/18 2308  AST 20  ALT 19  ALKPHOS 90  BILITOT 0.6  PROT 6.9  ALBUMIN 4.1   No results for input(s): LIPASE, AMYLASE in the last 168 hours. No results for input(s): AMMONIA in the last 168 hours.    HbA1C: No results for input(s): HGBA1C in the last 72 hours. CBG: No results for input(s): GLUCAP in the last 168 hours.    Urine analysis: No results found for: COLORURINE, APPEARANCEUR, LABSPEC, PHURINE, GLUCOSEU, HGBUR, BILIRUBINUR, KETONESUR, PROTEINUR, UROBILINOGEN, NITRITE, LEUKOCYTESUR     Cultures: No results found for: SDES, SPECREQUEST, CULT, REPTSTATUS   Radiological Exams on Admission: Ct Head Wo Contrast  Result Date: 03/04/2018 CLINICAL DATA:  Altered mental status EXAM: CT HEAD WITHOUT CONTRAST TECHNIQUE: Contiguous axial images were obtained from the base of the skull through the vertex without intravenous contrast. COMPARISON:  None. FINDINGS: BRAIN: There is sulcal and  ventricular prominence consistent with superficial and central atrophy. No intraparenchymal hemorrhage, mass effect nor midline shift. Periventricular and subcortical white matter hypodensities consistent with chronic small vessel ischemic disease are identified. No acute large vascular territory infarcts. No abnormal extra-axial fluid collections. Basal cisterns are not effaced and midline. VASCULAR: Moderate calcific atherosclerosis of the carotid siphons. SKULL: No skull fracture. No  significant scalp soft tissue swelling. SINUSES/ORBITS: Partial opacification of the right mastoids from small mastoid effusion. The included paranasal sinuses are well-aerated. Small mucous retention cysts or polyps in the posterior left maxillary sinus.The included ocular globes and orbital contents are non-suspicious. OTHER: None. IMPRESSION: 1. Atrophy with chronic appearing small vessel ischemia. 2. No acute intracranial abnormality. 3. Small mastoid effusion on the right. Findings may reflect right-sided otomastoiditis. Electronically Signed   By: Ashley Royalty M.D.   On: 03/04/2018 00:04   Dg Chest Port 1 View  Result Date: 03/04/2018 CLINICAL DATA:  Fever EXAM: PORTABLE CHEST 1 VIEW COMPARISON:  None. FINDINGS: Right base atelectasis. Left lower lobe airspace opacity concerning for pneumonia. Heart is borderline in size. No effusions or acute bony abnormality. IMPRESSION: Left lower lobe opacity concerning for pneumonia. Minimal right base atelectasis. Borderline heart size. Electronically Signed   By: Rolm Baptise M.D.   On: 03/04/2018 00:19    Chart has been reviewed    Assessment/Plan  67 y.o. male with medical history significant of  brain tumor and TBI, HLD, HTN, seizures  Admitted for sepsis secondary to left lower lobe pneumonia  Present on Admission: . Acute metabolic encephalopathy -   - most likely multifactorial secondary to combination of  infection   mild dehydration secondary to decreased by mouth intake,    - Will rehydrate   - treat underlining infection    - if no improvement may need further imaging to evaluate for CNS pathology, CT unremarkable   . 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 HCAP (healthcare-associated pneumonia) -  - will admit for treatment of HCAP will start on appropriate antibiotic coverage.   Obtain:  sputum cultures,                                   blood cultures                    strep pneumo UA antigen,                                   Provide oxygen as needed.   . Sepsis ([place]) -secondary to pneumonia will provide IV fluid resuscitation follow serial lactic acid.  Treat with cefepime metronidazole and thank obtain MRSA nasal swab Evaluate for aspiration  Strict seizure disorder continue Keppra  Stage I pressure changes on the buttocks will have wound consult follow to prevent progression  Other plan as per orders.  DVT prophylaxis:   Lovenox     Code Status:  DNR/DNI  as per paperwork   Family Communication:   Family not  at  Bedside    Disposition Plan:                             Back to current facility when stable  Would benefit from PT/OT eval prior to DC  Ordered                   Swallow eval - SLP ordered                   Social Work  consulted                   Nutrition    consulted                  Wound care  consulted                Consults called: none  Admission status:    inpatient    Level of care    medical floor             Toy Baker 03/04/2018, 1:30 AM    [facility]  Pager [phone]   after 2 AM please page floor coverage PA If 7AM-7PM, please contact the day team taking care of the patient  [email or web address]  Password [email or web address]

## 2018-03-04 NOTE — Progress Notes (Addendum)
OT Cancellation Note  Patient Details Name: Hector Patel MRN: [id number] DOB: 04/09/1951   Cancelled Treatment:    Reason Eval/Treat Not Completed: Other (comment). Pt declined OOB/mobility at this time. Plan to reattempt as schedule permits.  Per conversation with PT: Spoke with sister and she said that therapy has been tried numerous times with pt and has not worked out. He is mainly w/c bound. Declines any PT/OT in the hospital. Will sign off for now. thanks   Tyrone Schimke OTR/L Pager: [phone]  03/04/2018, 10:38 AM

## 2018-03-04 NOTE — Progress Notes (Signed)
Pt admitted to unit. Moved to bed from stretcher. No s/s distress or discomfort. Will continue to monitor.

## 2018-03-04 NOTE — ED Notes (Signed)
Attempted report x 1; name and call back number provided 

## 2018-03-04 NOTE — Progress Notes (Addendum)
PT Cancellation Note  Patient Details Name: Hector Patel MRN: [id number] DOB: 06-11-1951   Cancelled Treatment:    Reason Eval/Treat Not Completed: Patient declined, no reason specified Pt declined any mobility at this time. Will follow up as time allows.  Spoke with sister and she said that PT has been tried numerous times with pt and has not worked out. He is mainly w/c bound. Declines any PT in the hospital. Will sign off for now. thanks   Philip 03/04/2018, 10:38 AM Wray Kearns, PT, DPT [phone]

## 2018-03-04 NOTE — Progress Notes (Signed)
Pharmacy Antibiotic Note  Hector Patel is a 67 y.o. male admitted on 03/03/2018 with altered mental status.  Pharmacy has been consulted for Vancomycin/Cefepime dosing for HCAP. CXR with possible PNA. WBC elevated. Renal function ok.   Plan: Vancomycin 1000 mg IV q12h Cefepime 1g IV q8h Trend WBC, temp, renal function  F/U infectious work-up Drug levels as indicated   Weight: 181 lb 10.5 oz (82.4 kg)  Temp (24hrs), Avg:99.2 F (37.3 C), Min:98.4 F (36.9 C), Max:99.9 F (37.7 C)  Recent Labs  Lab 03/03/18 2308 03/03/18 2330 03/04/18 0159 03/04/18 0210  WBC 18.7*  --   --   --   CREATININE 0.98  --   --   --   LATICACIDVEN  --  2.22* 1.6 1.46    Narda Bonds 03/04/2018 2:51 AM

## 2018-03-04 NOTE — Consult Note (Signed)
[place] Nurse wound consult note Patient in [facility] [phone].  No family present.  NT sitter, Baltazar Najjar, present in room and assisted with attempted evaluation of sacrum.  Patient had on a pull up incontinent brief and a sacral foam dressing.  During turning process the patient struck out at staff and attempted to turn his IV pole over.  The patient was assisted back into a supine position.  I asked Baltazar Najjar if she had observed the patient turning himself in bed.  She had not.  The assessment of the sacrum was aborted due to the aggressive behavior.  I have explained what occurred to the patient's primary RN, Shaun. Reason for Consult: Sacral stage 1.  Not confirmed or refuted due to patient's aggressive behavior. For now, I have ordered an air mattress and turning for the patient.  Should the patient need further evaluation by the [place] team, please reconsult [place]. Thank you for the consult.  Discussed plan of care with the patient and bedside nurse.  [place] nurse will not follow at this time.  Please re-consult the [place] team if needed.  Val Riles, RN, MSN, CWOCN, CNS-BC, pager [phone]

## 2018-03-04 NOTE — Progress Notes (Signed)
Hector Patel is a 67 y.o. male with medical history significant of brain tumor and TBI, HLD, HTN, seizureshome presented with acute change in mental status was at baseline at 10 PM and became confused. Pt unable to provide hx. While in the emergency department patient noted to be aggressive towards staff and was given an extra dose of Valium.  CT head with no contrast done on admission unremarkable for any acute intracranial findings.  03/04/2018: Patient seen and examined with his sitter at his bedside.  He is somnolent post extra dose of Valium.  Later this morning RN reports more agitation.  Reordered.  Please refer to H&P dictated by Dr. Roel Cluck on 03/04/2018 for further details of the assessment and plan.

## 2018-03-04 NOTE — ED Notes (Signed)
Unable to collect labs,  Pt is combative.  RN aware

## 2018-03-04 NOTE — Evaluation (Signed)
Clinical/Bedside Swallow Evaluation Patient Details  Name: Hector Patel MRN: [id number] Date of Birth: Feb 18, 1951  Today's Date: 03/04/2018 Time: SLP Start Time (ACUTE ONLY): 1050 SLP Stop Time (ACUTE ONLY): 1105 SLP Time Calculation (min) (ACUTE ONLY): 15 min  Past Medical History:  Past Medical History:  Diagnosis Date  . Brain injury ([place])   . Brain tumor [facility])    Past Surgical History: History reviewed. No pertinent surgical history. HPI:  67 yo male adm to [facility] with AMS - PMH + for TBI, brain tumor, seizures on Keppra, behavioral issues/emotional outbursts.  Pt on puree/thin diet currently.  Swallow eval ordered. Concern for left lobe pna present.     Assessment / Plan / Recommendation Clinical Impression  Limited clinical swallow evaluation completed due to pt's limited participation.  Pt able to self feed with set up of soda, icecream and graham cracker.  NO Indication of airway compromise.  Pt needs to self feed to accept intake.  Recommend advance to regular/thin - No SlP follow up needed.  Recommend speak to family re: medication administration prior to admission.  SLP Visit Diagnosis: Dysphagia, unspecified (R13.10)    Aspiration Risk  No limitations    Diet Recommendation Regular;Thin liquid   Liquid Administration via: Cup;Straw Supervision: Patient able to self feed Compensations: Slow rate;Small sips/bites;Minimize environmental distractions    Other  Recommendations Oral Care Recommendations: Oral care BID   Follow up Recommendations None      Frequency and Duration     n/a       Prognosis   n/a     Swallow Study   General Date of Onset: 03/04/18 HPI: 67 yo male adm to [facility] with AMS - PMH + for TBI, brain tumor, seizures on Keppra, behavioral issues/emotional outbursts.  Pt on puree/thin diet currently.  Swallow eval ordered.  Type of Study: Bedside Swallow Evaluation Diet Prior to this Study: Dysphagia 1 (puree);Thin liquids Temperature Spikes Noted:  No Respiratory Status: Room air History of Recent Intubation: No Behavior/Cognition: Alert;Cooperative;Pleasant mood Oral Cavity Assessment: Other (comment)(pt barely opened mouth to allow SLP to view) Oral Care Completed by SLP: No Oral Cavity - Dentition: Adequate natural dentition Vision: Functional for self-feeding Self-Feeding Abilities: Able to feed self Patient Positioning: Upright in bed Baseline Vocal Quality: Normal Volitional Cough: Cognitively unable to elicit Volitional Swallow: Unable to elicit    Oral/Motor/Sensory Function Overall Oral Motor/Sensory Function: (pt did not follow directions, able to seal lips on straw)   Ice Chips Ice chips: Not tested   Thin Liquid Thin Liquid: Within functional limits Presentation: Cup;Straw    Nectar Thick Nectar Thick Liquid: Not tested   Honey Thick Honey Thick Liquid: Not tested   Puree Puree: Within functional limits Presentation: Self Fed;Spoon   Solid     Solid: Within functional limits Presentation: Self Fredirick Patel 03/04/2018,11:19 AM  Luanna Salk, [place] [facility] SLP [phone]

## 2018-03-05 DIAGNOSIS — G40909 Epilepsy, unspecified, not intractable, without status epilepticus: Secondary | ICD-10-CM

## 2018-03-05 DIAGNOSIS — S069X9S Unspecified intracranial injury with loss of consciousness of unspecified duration, sequela: Secondary | ICD-10-CM

## 2018-03-05 DIAGNOSIS — J189 Pneumonia, unspecified organism: Secondary | ICD-10-CM

## 2018-03-05 DIAGNOSIS — G9341 Metabolic encephalopathy: Secondary | ICD-10-CM

## 2018-03-05 DIAGNOSIS — R410 Disorientation, unspecified: Secondary | ICD-10-CM

## 2018-03-05 LAB — BASIC METABOLIC PANEL
ANION GAP: 5 (ref 5–15)
BUN: 17 mg/dL (ref 8–23)
CO2: 28 mmol/L (ref 22–32)
CREATININE: 0.95 mg/dL (ref 0.61–1.24)
Calcium: 8.4 mg/dL — ABNORMAL LOW (ref 8.9–10.3)
Chloride: 105 mmol/L (ref 98–111)
GFR calc Af Amer: >60 mL/min (ref >60)
GFR calc non Af Amer: >60 mL/min (ref >60)
Glucose, Bld: 114 mg/dL — ABNORMAL HIGH (ref 70–99)
Potassium: 3.8 mmol/L (ref 3.5–5.1)
Sodium: 138 mmol/L (ref 135–145)

## 2018-03-05 LAB — CBC
HCT: 39.4 % (ref 39.0–52.0)
Hemoglobin: 13.3 g/dL (ref 13.0–17.0)
MCH: 31.2 pg (ref 26.0–34.0)
MCHC: 33.8 g/dL (ref 30.0–36.0)
MCV: 92.5 fL (ref 78.0–100.0)
Platelets: 196 10*3/uL (ref 150–400)
RBC: 4.26 MIL/uL (ref 4.22–5.81)
RDW: 12.2 % (ref 11.5–15.5)
WBC: 10.2 10*3/uL (ref 4.0–10.5)

## 2018-03-05 LAB — MAGNESIUM: MAGNESIUM: 1.7 mg/dL (ref 1.7–2.4)

## 2018-03-05 MED ORDER — GUAIFENESIN ER 600 MG PO TB12: 600.0000 mg | ORAL_TABLET | Freq: Two times a day (BID) | ORAL | 0 refills | Status: AC

## 2018-03-05 MED ORDER — GUAIFENESIN ER 600 MG PO TB12: 600.0000 mg | ORAL_TABLET | Freq: Two times a day (BID) | ORAL | 0 refills | Status: DC

## 2018-03-05 MED ORDER — BUSPIRONE HCL 7.5 MG PO TABS: 10.0000 mg | ORAL_TABLET | Freq: Two times a day (BID) | ORAL | 0 refills | Status: DC

## 2018-03-05 MED ORDER — PHENYTOIN SODIUM EXTENDED 100 MG PO CAPS
300.0000 mg | ORAL_CAPSULE | Freq: Every day | ORAL | Status: DC
  Administered 2018-03-05: 300 mg via ORAL
  Filled 2018-03-05: qty 3

## 2018-03-05 MED ORDER — MAGNESIUM SULFATE 4 GM/100ML IV SOLN
4.0000 g | Freq: Once | INTRAVENOUS | Status: AC
  Administered 2018-03-05: 4 g via INTRAVENOUS
  Filled 2018-03-05: qty 100

## 2018-03-05 MED ORDER — OCUVITE-LUTEIN PO CAPS: 1.0000 | ORAL_CAPSULE | Freq: Two times a day (BID) | ORAL | 0 refills | Status: DC

## 2018-03-05 MED ORDER — SODIUM CHLORIDE 0.9 % IV SOLN
INTRAVENOUS | Status: DC | PRN
  Administered 2018-03-05: 250 mL via INTRAVENOUS

## 2018-03-05 MED ORDER — BUSPIRONE HCL 10 MG PO TABS: 10.0000 mg | ORAL_TABLET | Freq: Two times a day (BID) | ORAL | 0 refills | Status: DC

## 2018-03-05 NOTE — NC FL2 (Addendum)
[place] LEVEL OF CARE SCREENING TOOL     IDENTIFICATION  Patient Name: Hector Patel Birthdate: 1950/11/14 Sex: male Admission Date (Current Location): 03/03/2018  [facility] and [place] Number:  Herbalist and Address:  [facility], [place] [address] [place], [place] [address]      Provider Number: [id number]  Attending Physician Name and Address:  Kayleen Memos, DO  Relative Name and Phone Number:       Current Level of Care: Other (Comment)(Memory Care) Recommended Level of Care: Memory Care Prior Approval Number:    Date Approved/Denied:   PASRR Number:    Discharge Plan: Other (Comment)(ALF)    Current Diagnoses: Patient Active Problem List   Diagnosis Date Noted  . Acute metabolic encephalopathy 85/63/1497  . TBI (traumatic brain injury) ([place]) 03/04/2018  . HCAP (healthcare-associated pneumonia) 03/04/2018  . Sepsis ([place]) 03/04/2018  . Seizure disorder ([place]) 03/04/2018  . 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[place]) 11/08/2017    Orientation RESPIRATION BLADDER Height & Weight     Self, Situation, Place  Normal Continent Weight: 82.4 kg Height:     BEHAVIORAL SYMPTOMS/MOOD NEUROLOGICAL BOWEL NUTRITION STATUS      Continent Diet(Regular)  AMBULATORY STATUS COMMUNICATION OF NEEDS Skin   Extensive Assist Verbally Normal                       Personal Care Assistance Level of Assistance  Bathing, Feeding, Dressing Bathing Assistance: Maximum assistance Feeding assistance: Limited assistance Dressing Assistance: Limited assistance     Functional Limitations Info  Sight, Hearing, Speech Sight Info: Adequate Hearing Info: Impaired Speech Info: Adequate    SPECIAL CARE FACTORS FREQUENCY                       Contractures Contractures Info: Not present    Additional Factors Info  Code Status, Allergies, Psychotropic Code Status Info:  DNR Allergies Info: NKA Psychotropic Info: Buspar; Valium;Seroquel         Current Medications (03/05/2018):    Discharge Medications:  STOP taking these medications   Zinc 10 MG Lozg     TAKE these medications   acetaminophen 325 MG tablet Commonly known as:  TYLENOL Take 650 mg by mouth every 6 (six) hours as needed.   atorvastatin 40 MG tablet Commonly known as:  LIPITOR Take 20 mg by mouth at bedtime.   barrier cream Crea Commonly known as:  non-specified Apply 1 application topically as needed (Apply to buttocks with each brief change as needed for redness).   bisacodyl 10 MG suppository Commonly known as:  DULCOLAX Place 10 mg rectally daily as needed for moderate constipation.   busPIRone 10 MG tablet Commonly known as:  BUSPAR Take 1 tablet (10 mg total) by mouth 2 (two) times daily. What changed:  medication strength   cholecalciferol 1000 units tablet Commonly known as:  VITAMIN D Take 1,000 Units by mouth daily.   citalopram 20 MG tablet Commonly known as:  CELEXA Take 20 mg by mouth daily.   diazepam 2 MG tablet Commonly known as:  VALIUM Take 1 mg by mouth at bedtime.   guaiFENesin 600 MG 12 hr tablet Commonly known as:  MUCINEX Take 1 tablet (600 mg total) by mouth 2 (two) times daily for 5 days.   Levetiracetam 750 MG Tb24 Take 3,000 mg by mouth daily.   lisinopril 10 MG  tablet Commonly known as:  PRINIVIL,ZESTRIL Take 10 mg by mouth daily.   magnesium hydroxide 400 MG/5ML suspension Commonly known as:  MILK OF MAGNESIA Take 30 mLs by mouth daily as needed for mild constipation.   multivitamin-lutein Caps capsule Take 1 capsule by mouth 2 (two) times daily. PRESERVISION What changed:  additional instructions   phenytoin 100 MG ER capsule Commonly known as:  DILANTIN Take 300 mg by mouth daily.   polyethylene glycol packet Commonly known as:  MIRALAX / GLYCOLAX Take 17 g by mouth daily.   polyvinyl alcohol 1.4 %  ophthalmic solution Commonly known as:  LIQUIFILM TEARS Place 1 drop into both eyes every 8 (eight) hours as needed for dry eyes.   psyllium 28 % packet Commonly known as:  METAMUCIL SMOOTH TEXTURE Take 1 packet by mouth daily. Mix into 8 oz. Water and drink once daily   QUEtiapine 50 MG Tb24 24 hr tablet Commonly known as:  SEROQUEL XR Take 1 tablet (50 mg total) by mouth daily. What changed:  when to take this   ROBAFEN DM CGH/CHEST CONGEST 10-100 MG/5ML liquid Generic drug:  dextromethorphan-guaiFENesin Take 10 mLs by mouth every 4 (four) hours as needed for cough.   sodium phosphate 7-19 GM/118ML Enem Place 1 enema rectally daily as needed for severe constipation.   tamsulosin 0.4 MG Caps capsule Commonly known as:  FLOMAX Take 0.4 mg by mouth daily.       Relevant Imaging Results:  Relevant Lab Results:   Additional Information SSN: [place], [place]

## 2018-03-05 NOTE — Discharge Instructions (Signed)

## 2018-03-05 NOTE — Discharge Summary (Addendum)
Discharge Summary  Hector Patel UUV:[id number] DOB: 1951-01-28  PCP: Patient, No Pcp Per  Admit date: 03/03/2018 Discharge date: 03/05/2018  Time spent: 25 minutes  Recommendations for Outpatient Follow-up:  1. Follow-up with your PCP 2. Follow-up with your neurologist 3. Take your medication as prescribed  Discharge Diagnoses:  Active Hospital Problems   Diagnosis Date Noted  . Acute metabolic encephalopathy 47/42/5956  . TBI (traumatic brain injury) ([place]) 03/04/2018  . HCAP (healthcare-associated pneumonia) 03/04/2018  . Sepsis ([place]) 03/04/2018  . Seizure disorder ([place]) 03/04/2018    Resolved Hospital Problems  No resolved problems to display.    Discharge Condition: Stable  Diet recommendation: Resume previous diet  Vitals:   03/05/18 0813 03/05/18 1356  BP: (!) 143/76 130/76  Pulse: 79 73  Resp: 18 15  Temp: 98.3 F (36.8 C) 98.6 F (37 C)  SpO2: 100% 100%    History of present illness:  Hector Priceis a 67 y.o.malewith medical history significant of brain tumor and TBI, HLD, HTN,seizureshome presented withacute change in mental status was at baseline at 10 PM and became confused. Pt unable to provide hx. While in the emergency department patient noted to be aggressive towards staff and was given an extra dose of Valium.  CT head with no contrast done on admission unremarkable for any acute intracranial findings.  Urine analysis unremarkable.  Chest x-ray done on 03/04/2018 remarkable for mild left lower lobe opacity with suspicion for pneumonia.  Was started on IV cefepime and IV Flagyl.  Procalcitonin obtained and negative.  On 03/05/2018 leukocytosis resolved.  Afebrile and saturating 100% on room air.  Suspect viral etiology.  Stopped IV antibiotics.  03/05/2018: Patient seen and examined with a sitter at his bedside.  He appears calm.  No acute events overnight.  Patient is alert and more interactive and has no new complaints.  Spoke to his sister who is  also medical power of attorney and she stated that he is back to his baseline.  On the day of discharge, the patient was hemodynamically stable.  He will need to follow-up with his primary care provider, neurologist, post hospitalization.    Hospital Course:  Active Problems:   Acute metabolic encephalopathy   TBI (traumatic brain injury) ([place])   HCAP (healthcare-associated pneumonia)   Sepsis ([place])   Seizure disorder ([place])  Acute metabolic encephalopathy of unclear etiology in the setting of brain tumor and TBI Resolved and back to his baseline per his sister was also his medical power of attorney CT head with no contrast unremarkable for any acute intracranial findings Urine analysis negative Chest x-ray possible viral pneumonia  HCAP, suspect viral etiology Procalcitonin negative Afebrile Leukocytosis has resolved No tachycardia Vital signs stable with 100% O2 saturation on room air Stop IV antibiotics Completed 2 days of IV cefepime and IV Flagyl  History of seizures disorder Continue Keppra and Dilantin Continue previous seizure regimen Follow-up with neurology outpatient  History of brain tumor/TBI Continue home medications  Stage I sacral decubitus ulcer Recommend frequent turning  Hypomagnesemia Repleted with IV magnesium 2 g yesterday and 4 g today 03/05/2018  Ambulatory dysfunction Per the patient's sister patient is wheelchair-bound Medical POA Hector Patel PT assessment on the phone on 03/05/2018    Procedures:  None  Consultations:  None  Discharge Exam: BP 130/76 (BP Location: Left Arm)   Pulse 73   Temp 98.6 F (37 C) (Oral)   Resp 15   Wt 82.4 kg   SpO2 100%  . General:  67 y.o. year-old male well developed well nourished in no acute distress.  Alert and interactive . Cardiovascular: Regular rate and rhythm with no rubs or gallops.  No thyromegaly or JVD noted.   Marland Kitchen Respiratory: Clear to auscultation with no wheezes or rales. Good  inspiratory effort. . Abdomen: Soft nontender nondistended with normal bowel sounds x4 quadrants. . Musculoskeletal: No lower extremity edema. 2/4 pulses in all 4 extremities. Marland Kitchen Psychiatry: Mood is appropriate for condition and setting  Discharge Instructions You were cared for by a hospitalist during your hospital stay. If you have any questions about your discharge medications or the care you received while you were in the hospital after you are discharged, you can call the unit and asked to speak with the hospitalist on call if the hospitalist that took care of you is not available. Once you are discharged, your primary care physician will handle any further medical issues. Please note that NO REFILLS for any discharge medications will be authorized once you are discharged, as it is imperative that you return to your primary care physician (or establish a relationship with a primary care physician if you do not have one) for your aftercare needs so that they can reassess your need for medications and monitor your lab values.   Allergies as of 03/05/2018   No Known Allergies     Medication List    STOP taking these medications   Zinc 10 MG Lozg     TAKE these medications   acetaminophen 325 MG tablet Commonly known as:  TYLENOL Take 650 mg by mouth every 6 (six) hours as needed.   atorvastatin 40 MG tablet Commonly known as:  LIPITOR Take 20 mg by mouth at bedtime.   barrier cream Crea Commonly known as:  non-specified Apply 1 application topically as needed (Apply to buttocks with each brief change as needed for redness).   bisacodyl 10 MG suppository Commonly known as:  DULCOLAX Place 10 mg rectally daily as needed for moderate constipation.   busPIRone 10 MG tablet Commonly known as:  BUSPAR Take 1 tablet (10 mg total) by mouth 2 (two) times daily. What changed:  medication strength   cholecalciferol 1000 units tablet Commonly known as:  VITAMIN D Take 1,000 Units by  mouth daily.   citalopram 20 MG tablet Commonly known as:  CELEXA Take 20 mg by mouth daily.   diazepam 2 MG tablet Commonly known as:  VALIUM Take 1 mg by mouth at bedtime.   guaiFENesin 600 MG 12 hr tablet Commonly known as:  MUCINEX Take 1 tablet (600 mg total) by mouth 2 (two) times daily for 5 days.   Levetiracetam 750 MG Tb24 Take 3,000 mg by mouth daily.   lisinopril 10 MG tablet Commonly known as:  PRINIVIL,ZESTRIL Take 10 mg by mouth daily.   magnesium hydroxide 400 MG/5ML suspension Commonly known as:  MILK OF MAGNESIA Take 30 mLs by mouth daily as needed for mild constipation.   multivitamin-lutein Caps capsule Take 1 capsule by mouth 2 (two) times daily. PRESERVISION What changed:  additional instructions   phenytoin 100 MG ER capsule Commonly known as:  DILANTIN Take 300 mg by mouth daily.   polyethylene glycol packet Commonly known as:  MIRALAX / GLYCOLAX Take 17 g by mouth daily.   polyvinyl alcohol 1.4 % ophthalmic solution Commonly known as:  LIQUIFILM TEARS Place 1 drop into both eyes every 8 (eight) hours as needed for dry eyes.   psyllium 28 % packet Commonly  known as:  METAMUCIL SMOOTH TEXTURE Take 1 packet by mouth daily. Mix into 8 oz. Water and drink once daily   QUEtiapine 50 MG Tb24 24 hr tablet Commonly known as:  SEROQUEL XR Take 1 tablet (50 mg total) by mouth daily. What changed:  when to take this   ROBAFEN DM CGH/CHEST CONGEST 10-100 MG/5ML liquid Generic drug:  dextromethorphan-guaiFENesin Take 10 mLs by mouth every 4 (four) hours as needed for cough.   sodium phosphate 7-19 GM/118ML Enem Place 1 enema rectally daily as needed for severe constipation.   tamsulosin 0.4 MG Caps capsule Commonly known as:  FLOMAX Take 0.4 mg by mouth daily.      No Known Allergies Follow-up Information    [facility]. Call in 1 day(s).   Why:  Please call for an appointment. Contact information: [address] [place] [address] [phone]           The results of significant diagnostics from this hospitalization (including imaging, microbiology, ancillary and laboratory) are listed below for reference.    Significant Diagnostic Studies: Ct Head Wo Contrast  Result Date: 03/04/2018 CLINICAL DATA:  Altered mental status EXAM: CT HEAD WITHOUT CONTRAST TECHNIQUE: Contiguous axial images were obtained from the base of the skull through the vertex without intravenous contrast. COMPARISON:  None. FINDINGS: BRAIN: There is sulcal and ventricular prominence consistent with superficial and central atrophy. No intraparenchymal hemorrhage, mass effect nor midline shift. Periventricular and subcortical white matter hypodensities consistent with chronic small vessel ischemic disease are identified. No acute large vascular territory infarcts. No abnormal extra-axial fluid collections. Basal cisterns are not effaced and midline. VASCULAR: Moderate calcific atherosclerosis of the carotid siphons. SKULL: No skull fracture. No significant scalp soft tissue swelling. SINUSES/ORBITS: Partial opacification of the right mastoids from small mastoid effusion. The included paranasal sinuses are well-aerated. Small mucous retention cysts or polyps in the posterior left maxillary sinus.The included ocular globes and orbital contents are non-suspicious. OTHER: None. IMPRESSION: 1. Atrophy with chronic appearing small vessel ischemia. 2. No acute intracranial abnormality. 3. Small mastoid effusion on the right. Findings may reflect right-sided otomastoiditis. Electronically Signed   By: Ashley Royalty M.D.   On: 03/04/2018 00:04   Dg Chest Port 1 View  Result Date: 03/04/2018 CLINICAL DATA:  Fever EXAM: PORTABLE CHEST 1 VIEW COMPARISON:  None. FINDINGS: Right base atelectasis. Left lower lobe airspace opacity concerning for pneumonia. Heart is borderline in size. No effusions or acute bony abnormality.  IMPRESSION: Left lower lobe opacity concerning for pneumonia. Minimal right base atelectasis. Borderline heart size. Electronically Signed   By: Rolm Baptise M.D.   On: 03/04/2018 00:19    Microbiology: Recent Results (from the past 240 hour(s))  Blood Culture (routine x 2)     Status: None (Preliminary result)   Collection Time: 03/04/18 12:04 AM  Result Value Ref Range Status   Specimen Description BLOOD LEFT ANTECUBITAL  Final   Special Requests   Final    BOTTLES DRAWN AEROBIC AND ANAEROBIC Blood Culture adequate volume   Culture   Final    NO GROWTH 1 DAY Performed at [facility], [address], [place], [place] [address]    Report Status PENDING  Incomplete  Blood Culture (routine x 2)     Status: None (Preliminary result)   Collection Time: 03/04/18  2:01 AM  Result Value Ref Range Status   Specimen Description BLOOD LEFT ANTECUBITAL  Final   Special  Requests   Final    BOTTLES DRAWN AEROBIC AND ANAEROBIC Blood Culture adequate volume   Culture   Final    NO GROWTH 1 DAY Performed at [facility], [place] [address], [place], [place] [address]    Report Status PENDING  Incomplete  MRSA PCR Screening     Status: None   Collection Time: 03/04/18  3:30 AM  Result Value Ref Range Status   MRSA by PCR NEGATIVE NEGATIVE Final    Comment:        The GeneXpert MRSA Assay (FDA approved for NASAL specimens only), is one component of a comprehensive MRSA colonization surveillance program. It is not intended to diagnose MRSA infection nor to guide or monitor treatment for MRSA infections. Performed at [facility], [place] [address] [place], [place] [address]      Labs: Basic Metabolic Panel: Recent Labs  Lab 03/03/18 2308 03/04/18 0418 03/05/18 0414  NA 138 140 138  K 4.0 3.6 3.8  CL 100 105 105  CO2 28 28 28   GLUCOSE 119* 125* 114*  BUN 13 11 17   CREATININE 0.98 0.85 0.95  CALCIUM 9.1 8.6* 8.4*  MG  --  1.6* 1.7  PHOS  --  2.9  --    Liver  Function Tests: Recent Labs  Lab 03/03/18 2308 03/04/18 0418  AST 20 16  ALT 19 16  ALKPHOS 90 81  BILITOT 0.6 0.8  PROT 6.9 6.0*  ALBUMIN 4.1 3.5   No results for input(s): LIPASE, AMYLASE in the last 168 hours. No results for input(s): AMMONIA in the last 168 hours. CBC: Recent Labs  Lab 03/03/18 2308 03/04/18 0418 03/05/18 0414  WBC 18.7* 14.7* 10.2  HGB 16.2 14.7 13.3  HCT 47.6 43.5 39.4  MCV 93.0 91.8 92.5  PLT 251 206 196   Cardiac Enzymes: No results for input(s): CKTOTAL, CKMB, CKMBINDEX, TROPONINI in the last 168 hours. BNP: BNP (last 3 results) No results for input(s): BNP in the last 8760 hours.  ProBNP (last 3 results) No results for input(s): PROBNP in the last 8760 hours.  CBG: No results for input(s): GLUCAP in the last 168 hours.     Signed:  Kayleen Memos, MD [facility] 03/05/2018, 4:20 PM

## 2018-03-05 NOTE — Progress Notes (Signed)
Patient will DC to: [facility] Anticipated DC date: 03/05/18 Family notified: Sister Transport by: Sister by car   Per MD patient ready for DC to [facility]. RN, patient, patient's family, and facility notified of DC. Discharge Summary sent to facility. RN given number for report ([phone]).  CSW signing off.  Cedric Fishman, LCSW Clinical Social Worker [phone]

## 2018-03-05 NOTE — Clinical Social Work Note (Signed)
Clinical Social Work Assessment  Patient Details  Name: Hector Patel MRN: [id number] Date of Birth: 01-Nov-1950  Date of referral:  03/05/18               Reason for consult:  Discharge Planning                Permission sought to share information with:  Facility Sport and exercise psychologist, Family Supports Permission granted to share information::  No  Name::     Hector Patel  Agency::  [facility]  Relationship::  Sister  Contact Information:  [phone]  Housing/Transportation Living arrangements for the past 2 months:  [place] of Information:  Siblings, Facility Patient Interpreter Needed:  None Criminal Activity/Legal Involvement Pertinent to Current Situation/Hospitalization:  No - Comment as needed Significant Relationships:  Siblings Lives with:  Facility Resident Do you feel safe going back to the place where you live?  No Need for family participation in patient care:  Yes (Comment)  Care giving concerns:  CSW received consult regarding discharge planning. CSW spoke with patient's sister regarding return to memory care at [facility]. CSW to continue to follow and assist with discharge planning needs.   Social Worker assessment / plan:  CSW spoke with patient's sister and [facility] concerning return to memory care.  Employment status:  Retired Nurse, adult PT Recommendations:  Not assessed at this time Information / Referral to community resources:     Patient/Family's Response to care:  Patient's sister in agreement with discharge plan and will take patient back to [facility] by car.  Patient/Family's Understanding of and Emotional Response to Diagnosis, Current Treatment, and Prognosis:  Patient/family is realistic regarding therapy needs and expressed being hopeful for return to memory care. Patient's sister expressed understanding of CSW role and discharge process as well as medical condition. No questions/concerns  about plan or treatment.    Emotional Assessment Appearance:  Appears stated age Attitude/Demeanor/Rapport:  Unable to Assess Affect (typically observed):  Unable to Assess Orientation:  Oriented to Situation Alcohol / Substance use:  Not Applicable Psych involvement (Current and /or in the community):  No (Comment)  Discharge Needs  Concerns to be addressed:  Care Coordination Readmission within the last 30 days:  No Current discharge risk:  None Barriers to Discharge:  No Barriers Identified   Benard Halsted, [place] 03/05/2018, 3:25 PM

## 2018-03-05 NOTE — Care Management Note (Signed)
Case Management Note  Patient Details  Name: Hector Patel MRN: [id number] Date of Birth: 1951-04-09  Subjective/Objective:                    Action/Plan: Pt discharging back to [facility] today. CM signing off.   Expected Discharge Date:  03/05/18               Expected Discharge Plan:  Assisted Living / Rest Home  In-House Referral:  Clinical Social Work  Discharge planning Services     Post Acute Care Choice:    Choice offered to:     DME Arranged:    DME Agency:     HH Arranged:    [place] Agency:     Status of Service:  Completed, signed off  If discussed at [employer], dates discussed:    Additional Comments:  Pollie Friar, RN 03/05/2018, 3:15 PM

## 2018-03-09 LAB — CULTURE, BLOOD (ROUTINE X 2)
CULTURE: NO GROWTH
Culture: NO GROWTH
Special Requests: ADEQUATE
Special Requests: ADEQUATE

## 2019-03-28 ENCOUNTER — Emergency Department (HOSPITAL_COMMUNITY): Payer: Medicare PPO

## 2019-03-28 ENCOUNTER — Emergency Department (HOSPITAL_COMMUNITY)
Admission: EM | Admit: 2019-03-28 | Discharge: 2019-03-28 | Disposition: A | Discharge status: Home / Self Care | Payer: Medicare PPO | Attending: Emergency Medicine | Admitting: Emergency Medicine

## 2019-03-28 ENCOUNTER — Other Ambulatory Visit: Payer: Self-pay

## 2019-03-28 DIAGNOSIS — Z23 Encounter for immunization: Secondary | ICD-10-CM | POA: Diagnosis not present

## 2019-03-28 DIAGNOSIS — W19XXXA Unspecified fall, initial encounter: Secondary | ICD-10-CM | POA: Insufficient documentation

## 2019-03-28 DIAGNOSIS — S022XXA Fracture of nasal bones, initial encounter for closed fracture: Secondary | ICD-10-CM

## 2019-03-28 DIAGNOSIS — F0390 Unspecified dementia without behavioral disturbance: Secondary | ICD-10-CM | POA: Diagnosis not present

## 2019-03-28 DIAGNOSIS — Z85841 Personal history of malignant neoplasm of brain: Secondary | ICD-10-CM | POA: Diagnosis not present

## 2019-03-28 DIAGNOSIS — S12591A Other nondisplaced fracture of sixth cervical vertebra, initial encounter for closed fracture: Secondary | ICD-10-CM | POA: Diagnosis not present

## 2019-03-28 DIAGNOSIS — S129XXA Fracture of neck, unspecified, initial encounter: Secondary | ICD-10-CM

## 2019-03-28 DIAGNOSIS — S12491A Other nondisplaced fracture of fifth cervical vertebra, initial encounter for closed fracture: Secondary | ICD-10-CM | POA: Diagnosis not present

## 2019-03-28 DIAGNOSIS — Y999 Unspecified external cause status: Secondary | ICD-10-CM | POA: Diagnosis not present

## 2019-03-28 DIAGNOSIS — S0181XA Laceration without foreign body of other part of head, initial encounter: Secondary | ICD-10-CM | POA: Diagnosis present

## 2019-03-28 DIAGNOSIS — Y939 Activity, unspecified: Secondary | ICD-10-CM | POA: Insufficient documentation

## 2019-03-28 DIAGNOSIS — Y92122 Bedroom in nursing home as the place of occurrence of the external cause: Secondary | ICD-10-CM | POA: Insufficient documentation

## 2019-03-28 DIAGNOSIS — Z79899 Other long term (current) drug therapy: Secondary | ICD-10-CM | POA: Insufficient documentation

## 2019-03-28 DIAGNOSIS — S0990XA Unspecified injury of head, initial encounter: Secondary | ICD-10-CM

## 2019-03-28 MED ORDER — ACETAMINOPHEN 500 MG PO TABS
1000.0000 mg | ORAL_TABLET | Freq: Once | ORAL | Status: AC
  Administered 2019-03-28: 1000 mg via ORAL
  Filled 2019-03-28: qty 2

## 2019-03-28 MED ORDER — TETANUS-DIPHTH-ACELL PERTUSSIS 5-2.5-18.5 LF-MCG/0.5 IM SUSP
0.5000 mL | Freq: Once | INTRAMUSCULAR | Status: AC
  Administered 2019-03-28: 07:00:00 0.5 mL via INTRAMUSCULAR
  Filled 2019-03-28: qty 0.5

## 2019-03-28 NOTE — ED Notes (Signed)
DNR with pt on arrival

## 2019-03-28 NOTE — ED Notes (Signed)
NOTE CT head w/o contrast has not yet been performed

## 2019-03-28 NOTE — ED Provider Notes (Signed)
TIME SEEN: 5:29 AM  CHIEF COMPLAINT: Unwitnessed fall  HPI: Patient is a 68 year old male who presents to the ED from [facility] memory care with unwitnessed fall.  Has history of dementia.  Per EMS, patient was last checked on around 3 AM and then was found at 4:30 AM on the floor.  He was conscious, alert.  Vital signs with EMS normal.  Laceration to the right forehead.  Unknown known last tetanus vaccination.  Reportedly has history of repeated falls.  At his neurologic baseline per nursing home staff.  Patient very hard of hearing.  Patient states that he has having neck pain on the left side.  ROS: Level 5 caveat for dementia  PAST MEDICAL HISTORY/PAST SURGICAL HISTORY:  Past Medical History:  Diagnosis Date  . 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 Brain tumor [facility])     MEDICATIONS:  Prior to Admission medications   Medication Sig Start Date End Date Taking? Authorizing Provider  acetaminophen (TYLENOL) 325 MG tablet Take 650 mg by mouth every 6 (six) hours as needed.    [provider]  atorvastatin (LIPITOR) 40 MG tablet Take 20 mg by mouth at bedtime.     [provider]  barrier cream (NON-SPECIFIED) CREA Apply 1 application topically as needed (Apply to buttocks with each brief change as needed for redness).    [provider]  bisacodyl (DULCOLAX) 10 MG suppository Place 10 mg rectally daily as needed for moderate constipation.    [provider]  busPIRone (BUSPAR) 10 MG tablet Take 1 tablet (10 mg total) by mouth 2 (two) times daily. 03/05/18   Kayleen Memos, DO  cholecalciferol (VITAMIN D) 1000 units tablet Take 1,000 Units by mouth daily.    [provider]  citalopram (CELEXA) 20 MG tablet Take 20 mg by mouth daily.    [provider]  dextromethorphan-guaiFENesin (ROBAFEN DM CGH/CHEST CONGEST) 10-100 MG/5ML liquid Take 10 mLs by mouth every 4 (four) hours as needed for cough.    [provider]  diazepam (VALIUM) 2 MG tablet  Take 1 mg by mouth at bedtime.    [provider]  Levetiracetam 750 MG TB24 Take 3,000 mg by mouth daily.     [provider]  lisinopril (PRINIVIL,ZESTRIL) 10 MG tablet Take 10 mg by mouth daily.    [provider]  magnesium hydroxide (MILK OF MAGNESIA) 400 MG/5ML suspension Take 30 mLs by mouth daily as needed for mild constipation.    [provider]  multivitamin-lutein (OCUVITE-LUTEIN) CAPS capsule Take 1 capsule by mouth 2 (two) times daily. PRESERVISION 03/05/18   Kayleen Memos, DO  phenytoin (DILANTIN) 100 MG ER capsule Take 300 mg by mouth daily.    [provider]  polyethylene glycol (MIRALAX / GLYCOLAX) packet Take 17 g by mouth daily.    [provider]  polyvinyl alcohol (LIQUIFILM TEARS) 1.4 % ophthalmic solution Place 1 drop into both eyes every 8 (eight) hours as needed for dry eyes.    [provider]  psyllium (METAMUCIL SMOOTH TEXTURE) 28 % packet Take 1 packet by mouth daily. Mix into 8 oz. Water and drink once daily    [provider]  QUEtiapine (SEROQUEL XR) 50 MG TB24 24 hr tablet Take 1 tablet (50 mg total) by mouth daily. Patient taking differently: Take 50 mg by mouth at bedtime.  11/08/17   Patrecia Pour, NP  sodium phosphate (FLEET) 7-19 GM/118ML ENEM Place 1 enema rectally daily as needed  for severe constipation.    [provider]  tamsulosin (FLOMAX) 0.4 MG CAPS capsule Take 0.4 mg by mouth daily.    [provider]    ALLERGIES:  No Known Allergies  SOCIAL HISTORY:  Social History   Tobacco Use  . Smoking status: Never Smoker  Substance Use Topics  . Alcohol use: Not on file    FAMILY HISTORY: No family history on file.  EXAM: BP (!) 164/87 (BP Location: Left Arm)   Pulse (!) 57   Temp 98.4 F (36.9 C) (Oral)   Resp 16   SpO2 98%  CONSTITUTIONAL: Alert and oriented to person but not place, time or situation and responds appropriately to questions.   Elderly, hard of hearing, making jokes, laughing HEAD: Normocephalic; stellate-like superficial laceration with good approximation just above the inner aspect of the right eyebrow without foreign body EYES: Conjunctivae clear, PERRL, EOMI ENT: normal nose; no rhinorrhea; moist mucous membranes; pharynx without lesions noted; no dental injury; no septal hematoma NECK: Supple, no meningismus, no LAD; no midline spinal tenderness, step-off or deformity; trachea midline, cervical collar in place CARD: RRR; S1 and S2 appreciated; no murmurs, no clicks, no rubs, no gallops RESP: Normal chest excursion without splinting or tachypnea; breath sounds clear and equal bilaterally; no wheezes, no rhonchi, no rales; no hypoxia or respiratory distress CHEST:  chest wall stable, no crepitus or ecchymosis or deformity, nontender to palpation; no flail chest ABD/GI: Normal bowel sounds; non-distended; soft, non-tender, no rebound, no guarding; no ecchymosis or other lesions noted PELVIS:  stable, nontender to palpation, no leg length discrepancy BACK:  The back appears normal and is non-tender to palpation, there is no CVA tenderness; no midline spinal tenderness, step-off or deformity EXT: Normal ROM in all joints; non-tender to palpation; no edema; normal capillary refill; no cyanosis, no bony tenderness or bony deformity of patient's extremities, no joint effusion, compartments are soft, extremities are warm and well-perfused, no ecchymosis SKIN: Normal color for age and race; warm NEURO: Moves all extremities equally, normal speech, no facial asymmetry PSYCH: The patient's mood and manner are appropriate. Grooming and personal hygiene are appropriate.  MEDICAL DECISION MAKING: Patient here after unwitnessed fall.  At his neurologic baseline per nursing home staff.  Will obtain CT of the head, skull looks fine and face.  Will update tetanus vaccination.  Laceration has been repaired using Dermabond.  Given Tylenol  for pain.  Patient appears very comfortable and is laughing, making jokes.  No other sign of trauma on exam.  ED PROGRESS: Laceration easily repaired with Dermabond.  Tetanus vaccination updated.  CTs show no intracranial injury.  He has bilateral nasal bone fractures and C5 and C6 spinous process fracture.  Will discuss with neurosurgery on call for recommendations.  7:05 AM  D/w Dr. Annette Stable with neurosurgery.  Will place in [place] collar and can follow up as outpatient.  Does not need MRI of the cervical spine.  7:20 AM  I have updated patient's brother-in-law and at [phone].  He will update patient's sister, Manuela Schwartz.  We will discharge patient back to his nursing facility.  They may use Tylenol as needed for pain.  At this time, I do not feel there is any life-threatening condition present. I have reviewed and discussed all results (EKG, imaging, lab, urine as appropriate) and exam findings with patient/family. I have reviewed nursing notes and appropriate previous records.  I feel the patient is safe to be discharged home without further emergent workup and  can continue workup as an outpatient as needed. Discussed usual and customary return precautions. Patient/family verbalize understanding and are comfortable with this plan.  Outpatient follow-up has been provided as needed. All questions have been answered.   LACERATION REPAIR Performed by: Pryor Curia Authorized by: Pryor Curia Consent: Verbal consent obtained. Risks and benefits: risks, benefits and alternatives were discussed Consent given by: patient Patient identity confirmed: provided demographic data Prepped and Draped in normal sterile fashion Wound explored  Laceration Location: Right forehead  Laceration Length: 5 cm  No Foreign Bodies seen or palpated  Anesthesia: None required  Irrigation method: syringe Amount of cleaning: standard  Skin closure: Using Dermabond  Number of sutures: 0   Patient tolerance:  Patient tolerated the procedure well with no immediate complications.       Zuleyma Scharf, Delice Bison, DO 03/28/19 [phone]

## 2019-03-28 NOTE — Discharge Instructions (Addendum)
You may take Tylenol 1000 mg every 6 hours as needed for pain.  We have used Dermabond to close your facial lacerations.  Please do not apply Neosporin, bacitracin or triple antibiotic ointment to these areas as this can breakdown the Dermabond prematurely.  This glue will dissolve over time.  You may clean these areas gently with warm soap and water as needed.  You do have a nasal fracture.  This does not need any intervention but you can follow-up with ENT if desired.  You do have 2 small spinous process fractures of your cervical spine.  Please follow-up with Dr. Annette Stable but this is not an injury that will need surgical intervention.  At this time you will need to wear cervical collar at all times until cleared by neurosurgery.

## 2019-03-28 NOTE — ED Notes (Signed)
Pt transported to CT ?

## 2019-04-20 ENCOUNTER — Ambulatory Visit: Payer: Medicare PPO | Admitting: Cardiology

## 2020-12-30 ENCOUNTER — Other Ambulatory Visit: Payer: Self-pay

## 2020-12-30 ENCOUNTER — Encounter (HOSPITAL_COMMUNITY): Payer: Self-pay | Admitting: Internal Medicine

## 2020-12-30 ENCOUNTER — Inpatient Hospital Stay (HOSPITAL_COMMUNITY)
Admission: EM | Admit: 2020-12-30 | Discharge: 2021-01-01 | DRG: 951 | Disposition: A | Discharge status: Hospice | Payer: Medicare PPO | Source: Skilled Nursing Facility | Attending: Student | Admitting: Student

## 2020-12-30 ENCOUNTER — Emergency Department (HOSPITAL_COMMUNITY): Payer: Medicare PPO

## 2020-12-30 DIAGNOSIS — E785 Hyperlipidemia, unspecified: Secondary | ICD-10-CM | POA: Diagnosis present

## 2020-12-30 DIAGNOSIS — Z20822 Contact with and (suspected) exposure to covid-19: Secondary | ICD-10-CM | POA: Diagnosis present

## 2020-12-30 DIAGNOSIS — F0390 Unspecified dementia without behavioral disturbance: Secondary | ICD-10-CM | POA: Diagnosis present

## 2020-12-30 DIAGNOSIS — Z6832 Body mass index (BMI) 32.0-32.9, adult: Secondary | ICD-10-CM

## 2020-12-30 DIAGNOSIS — E119 Type 2 diabetes mellitus without complications: Secondary | ICD-10-CM | POA: Diagnosis present

## 2020-12-30 DIAGNOSIS — Z66 Do not resuscitate: Secondary | ICD-10-CM | POA: Diagnosis present

## 2020-12-30 DIAGNOSIS — R6510 Systemic inflammatory response syndrome (SIRS) of non-infectious origin without acute organ dysfunction: Secondary | ICD-10-CM

## 2020-12-30 DIAGNOSIS — L89151 Pressure ulcer of sacral region, stage 1: Secondary | ICD-10-CM

## 2020-12-30 DIAGNOSIS — S069X9S Unspecified intracranial injury with loss of consciousness of unspecified duration, sequela: Secondary | ICD-10-CM

## 2020-12-30 DIAGNOSIS — H547 Unspecified visual loss: Secondary | ICD-10-CM | POA: Diagnosis present

## 2020-12-30 DIAGNOSIS — Z515 Encounter for palliative care: Secondary | ICD-10-CM

## 2020-12-30 DIAGNOSIS — N390 Urinary tract infection, site not specified: Secondary | ICD-10-CM | POA: Diagnosis present

## 2020-12-30 DIAGNOSIS — R532 Functional quadriplegia: Secondary | ICD-10-CM | POA: Diagnosis present

## 2020-12-30 DIAGNOSIS — L89159 Pressure ulcer of sacral region, unspecified stage: Secondary | ICD-10-CM | POA: Diagnosis present

## 2020-12-30 DIAGNOSIS — N179 Acute kidney failure, unspecified: Secondary | ICD-10-CM | POA: Diagnosis present

## 2020-12-30 DIAGNOSIS — Z8673 Personal history of transient ischemic attack (TIA), and cerebral infarction without residual deficits: Secondary | ICD-10-CM

## 2020-12-30 DIAGNOSIS — L89152 Pressure ulcer of sacral region, stage 2: Secondary | ICD-10-CM | POA: Diagnosis present

## 2020-12-30 DIAGNOSIS — E669 Obesity, unspecified: Secondary | ICD-10-CM | POA: Diagnosis present

## 2020-12-30 DIAGNOSIS — E860 Dehydration: Secondary | ICD-10-CM | POA: Diagnosis present

## 2020-12-30 DIAGNOSIS — R410 Disorientation, unspecified: Secondary | ICD-10-CM

## 2020-12-30 DIAGNOSIS — I959 Hypotension, unspecified: Secondary | ICD-10-CM | POA: Diagnosis present

## 2020-12-30 DIAGNOSIS — R000 Tachycardia, unspecified: Secondary | ICD-10-CM | POA: Diagnosis present

## 2020-12-30 DIAGNOSIS — G9341 Metabolic encephalopathy: Secondary | ICD-10-CM | POA: Diagnosis not present

## 2020-12-30 DIAGNOSIS — Z85841 Personal history of malignant neoplasm of brain: Secondary | ICD-10-CM

## 2020-12-30 DIAGNOSIS — I10 Essential (primary) hypertension: Secondary | ICD-10-CM | POA: Diagnosis present

## 2020-12-30 DIAGNOSIS — G40909 Epilepsy, unspecified, not intractable, without status epilepticus: Secondary | ICD-10-CM | POA: Diagnosis present

## 2020-12-30 DIAGNOSIS — H9190 Unspecified hearing loss, unspecified ear: Secondary | ICD-10-CM | POA: Diagnosis present

## 2020-12-30 DIAGNOSIS — S069XAA Unspecified intracranial injury with loss of consciousness status unknown, initial encounter: Secondary | ICD-10-CM | POA: Diagnosis present

## 2020-12-30 DIAGNOSIS — Z7401 Bed confinement status: Secondary | ICD-10-CM

## 2020-12-30 DIAGNOSIS — D72829 Elevated white blood cell count, unspecified: Secondary | ICD-10-CM | POA: Diagnosis present

## 2020-12-30 DIAGNOSIS — E870 Hyperosmolality and hypernatremia: Secondary | ICD-10-CM | POA: Diagnosis present

## 2020-12-30 DIAGNOSIS — K5900 Constipation, unspecified: Secondary | ICD-10-CM

## 2020-12-30 DIAGNOSIS — I639 Cerebral infarction, unspecified: Secondary | ICD-10-CM | POA: Diagnosis present

## 2020-12-30 DIAGNOSIS — Z8249 Family history of ischemic heart disease and other diseases of the circulatory system: Secondary | ICD-10-CM

## 2020-12-30 DIAGNOSIS — Z87820 Personal history of traumatic brain injury: Secondary | ICD-10-CM

## 2020-12-30 DIAGNOSIS — Z7984 Long term (current) use of oral hypoglycemic drugs: Secondary | ICD-10-CM

## 2020-12-30 DIAGNOSIS — R6520 Severe sepsis without septic shock: Secondary | ICD-10-CM | POA: Diagnosis present

## 2020-12-30 DIAGNOSIS — A419 Sepsis, unspecified organism: Secondary | ICD-10-CM | POA: Diagnosis present

## 2020-12-30 LAB — COMPREHENSIVE METABOLIC PANEL
ALT: 31 U/L (ref 0–44)
AST: 41 U/L (ref 15–41)
Albumin: 3.2 g/dL — ABNORMAL LOW (ref 3.5–5.0)
Alkaline Phosphatase: 126 U/L (ref 38–126)
Anion gap: 9 (ref 5–15)
BUN: 43 mg/dL — ABNORMAL HIGH (ref 8–23)
CO2: 23 mmol/L (ref 22–32)
Calcium: 8.8 mg/dL — ABNORMAL LOW (ref 8.9–10.3)
Chloride: 114 mmol/L — ABNORMAL HIGH (ref 98–111)
Creatinine, Ser: 1.44 mg/dL — ABNORMAL HIGH (ref 0.61–1.24)
GFR, Estimated: 52 mL/min — ABNORMAL LOW (ref >60)
Glucose, Bld: 137 mg/dL — ABNORMAL HIGH (ref 70–99)
Potassium: 4.3 mmol/L (ref 3.5–5.1)
Sodium: 146 mmol/L — ABNORMAL HIGH (ref 135–145)
Total Bilirubin: 1.4 mg/dL — ABNORMAL HIGH (ref 0.3–1.2)
Total Protein: 6.4 g/dL — ABNORMAL LOW (ref 6.5–8.1)

## 2020-12-30 LAB — CBC WITH DIFFERENTIAL/PLATELET
Abs Immature Granulocytes: 0.05 10*3/uL (ref 0.00–0.07)
Basophils Absolute: 0.1 10*3/uL (ref 0.0–0.1)
Basophils Relative: 0 %
Eosinophils Absolute: 0.2 10*3/uL (ref 0.0–0.5)
Eosinophils Relative: 1 %
HCT: 51.6 % (ref 39.0–52.0)
Hemoglobin: 17.4 g/dL — ABNORMAL HIGH (ref 13.0–17.0)
Immature Granulocytes: 0 %
Lymphocytes Relative: 20 %
Lymphs Abs: 3.1 10*3/uL (ref 0.7–4.0)
MCH: 31.6 pg (ref 26.0–34.0)
MCHC: 33.7 g/dL (ref 30.0–36.0)
MCV: 93.6 fL (ref 80.0–100.0)
Monocytes Absolute: 1.9 10*3/uL — ABNORMAL HIGH (ref 0.1–1.0)
Monocytes Relative: 13 %
Neutro Abs: 10 10*3/uL — ABNORMAL HIGH (ref 1.7–7.7)
Neutrophils Relative %: 66 %
Platelets: 265 10*3/uL (ref 150–400)
RBC: 5.51 MIL/uL (ref 4.22–5.81)
RDW: 12 % (ref 11.5–15.5)
WBC: 15.2 10*3/uL — ABNORMAL HIGH (ref 4.0–10.5)
nRBC: 0 % (ref 0.0–0.2)

## 2020-12-30 LAB — PROTIME-INR
INR: 1.6 — ABNORMAL HIGH (ref 0.8–1.2)
Prothrombin Time: 19.3 seconds — ABNORMAL HIGH (ref 11.4–15.2)

## 2020-12-30 LAB — URINALYSIS, ROUTINE W REFLEX MICROSCOPIC
Bacteria, UA: NONE SEEN
Bilirubin Urine: NEGATIVE
Glucose, UA: NEGATIVE mg/dL
Hgb urine dipstick: NEGATIVE
Ketones, ur: 80 mg/dL — AB
Nitrite: NEGATIVE
Protein, ur: NEGATIVE mg/dL
Specific Gravity, Urine: 1.029 (ref 1.005–1.030)
pH: 5 (ref 5.0–8.0)

## 2020-12-30 LAB — APTT: aPTT: 32 seconds (ref 24–36)

## 2020-12-30 LAB — LACTIC ACID, PLASMA: Lactic Acid, Venous: 1.6 mmol/L (ref 0.5–1.9)

## 2020-12-30 MED ORDER — SODIUM CHLORIDE 0.9 % IV BOLUS
500.0000 mL | Freq: Once | INTRAVENOUS | Status: AC
  Administered 2020-12-30: 500 mL via INTRAVENOUS

## 2020-12-30 MED ORDER — LEVETIRACETAM IN NACL 1500 MG/100ML IV SOLN
1500.0000 mg | Freq: Two times a day (BID) | INTRAVENOUS | Status: DC
  Administered 2020-12-31 – 2021-01-01 (×3): 1500 mg via INTRAVENOUS
  Filled 2020-12-30 (×5): qty 100

## 2020-12-30 MED ORDER — ACETAMINOPHEN 650 MG RE SUPP
650.0000 mg | Freq: Four times a day (QID) | RECTAL | Status: DC | PRN
Start: 2020-12-30 — End: 2021-01-02

## 2020-12-30 MED ORDER — ATORVASTATIN CALCIUM 10 MG PO TABS: 20.0000 mg | ORAL_TABLET | Freq: Every day | ORAL | Status: DC

## 2020-12-30 MED ORDER — PHENYTOIN SODIUM EXTENDED 100 MG PO CAPS: 300.0000 mg | ORAL_CAPSULE | Freq: Every day | ORAL | Status: DC

## 2020-12-30 MED ORDER — SODIUM CHLORIDE 0.9 % IV SOLN: 1.0000 g | INTRAVENOUS | Status: DC

## 2020-12-30 MED ORDER — ACETAMINOPHEN 325 MG PO TABS: 650.0000 mg | ORAL_TABLET | Freq: Four times a day (QID) | ORAL | Status: DC | PRN

## 2020-12-30 MED ORDER — ASPIRIN 300 MG RE SUPP
300.0000 mg | Freq: Once | RECTAL | Status: AC
  Administered 2020-12-31: 300 mg via RECTAL
  Filled 2020-12-30: qty 1

## 2020-12-30 MED ORDER — POLYETHYLENE GLYCOL 3350 17 G PO PACK: 17.0000 g | PACK | Freq: Every day | ORAL | Status: DC

## 2020-12-30 MED ORDER — CITALOPRAM HYDROBROMIDE 10 MG PO TABS: 10.0000 mg | ORAL_TABLET | Freq: Every day | ORAL | Status: DC

## 2020-12-30 MED ORDER — SODIUM CHLORIDE 0.9 % IV SOLN
75.0000 mL/h | INTRAVENOUS | Status: DC
  Administered 2020-12-31: 75 mL/h via INTRAVENOUS

## 2020-12-30 MED ORDER — QUETIAPINE FUMARATE 25 MG PO TABS
25.0000 mg | ORAL_TABLET | Freq: Every day | ORAL | Status: DC
  Filled 2020-12-30 (×2): qty 1

## 2020-12-30 MED ORDER — PHENYTOIN SODIUM 50 MG/ML IJ SOLN
100.0000 mg | Freq: Three times a day (TID) | INTRAMUSCULAR | Status: DC
  Administered 2020-12-31 – 2021-01-01 (×7): 100 mg via INTRAVENOUS
  Filled 2020-12-30 (×9): qty 2

## 2020-12-30 MED ORDER — INSULIN ASPART 100 UNIT/ML IJ SOLN: 0.0000 [IU] | INTRAMUSCULAR | Status: DC

## 2020-12-30 MED ORDER — QUETIAPINE FUMARATE ER 50 MG PO TB24
50.0000 mg | ORAL_TABLET | Freq: Every day | ORAL | Status: DC
  Filled 2020-12-30 (×2): qty 1

## 2020-12-30 MED ORDER — BUSPIRONE HCL 5 MG PO TABS: 10.0000 mg | ORAL_TABLET | Freq: Three times a day (TID) | ORAL | Status: DC

## 2020-12-30 MED ORDER — DOCUSATE SODIUM 100 MG PO CAPS: 100.0000 mg | ORAL_CAPSULE | Freq: Two times a day (BID) | ORAL | Status: DC

## 2020-12-30 MED ORDER — ASPIRIN EC 81 MG PO TBEC: 81.0000 mg | DELAYED_RELEASE_TABLET | Freq: Every day | ORAL | Status: DC

## 2020-12-30 MED ORDER — SODIUM CHLORIDE 0.9 % IV SOLN
1.0000 g | Freq: Once | INTRAVENOUS | Status: AC
Start: 2020-12-30 — End: 2020-12-30
  Administered 2020-12-30: 1 g via INTRAVENOUS
  Filled 2020-12-30: qty 10

## 2020-12-30 MED ORDER — LEVETIRACETAM ER 750 MG PO TB24: 3000.0000 mg | ORAL_TABLET | Freq: Every day | ORAL | Status: DC

## 2020-12-30 NOTE — H&P (Addendum)
Hector Patel RAQ:[id number] DOB: Jan 19, 1951 DOA: 12/30/2020     PCP: Patient, No Pcp Per (Inactive)   Outpatient Specialists:     NEurology   Dr. Earnie Larsson, Acquanetta Sit, MD      Patient arrived to ER on 12/30/20 at 1637 Referred by Attending Carmin Muskrat, MD   Patient coming from:  From [facility]  Chief Complaint:   Chief Complaint  Patient presents with   Altered Mental Status    HPI: Hector Patel is a 70 y.o. male with medical history significant of brain tumor very hard of hearing.   Seizure disorder, dementia, HLD, diabetes mellitus  Presented with   decreased responsiveness.  Typically he is making eye contact and more interactive last time seen normal 9 hours ago. Has been progressively declining for the past 6 months no longer ambulatory and able to get out of bed he has sacral decub for which she is getting wound care. Initially on arrival patient was mildly hypotensive which improved with IV fluids COVID testing recently negative      Initial COVID TEST  in house  PCR testing  Pending  No results found for: SARSCOV2NAA   Regarding pertinent Chronic problems:     Hyperlipidemia -  on statins Lipitor   Seizure disorder on Keppra and Dilantin  HTN on was on lisinopril    DM 2 -  on  PO meds only      Dementia - on seroquel     While in ER: Noted to be in AKI tachycardic poorly responsive. Responsive to pain. Elevated white blood cell count up to 15.2   ED Triage Vitals  Enc Vitals Group     BP 12/30/20 1641 129/82     Pulse Rate 12/30/20 1641 (!) 105     Resp 12/30/20 1641 20     Temp 12/30/20 1646 99.8 F (37.7 C)     Temp Source 12/30/20 1646 Rectal     SpO2 12/30/20 1641 96 %     Weight 12/30/20 1645 185 lb (83.9 kg)     Height 12/30/20 1645 6' (1.829 m)     Head Circumference --      Peak Flow --      Pain Score 12/30/20 1644 Asleep     Pain Loc --      Pain Edu? --      Excl. in [place]? --   TMAX(24)@      _________________________________________ Significant initial  Findings: Abnormal Labs Reviewed  COMPREHENSIVE METABOLIC PANEL - Abnormal; Notable for the following components:      Result Value   Sodium 146 (*)    Chloride 114 (*)    Glucose, Bld 137 (*)    BUN 43 (*)    Creatinine, Ser 1.44 (*)    Calcium 8.8 (*)    Total Protein 6.4 (*)    Albumin 3.2 (*)    Total Bilirubin 1.4 (*)    GFR, Estimated 52 (*)    All other components within normal limits  CBC WITH DIFFERENTIAL/PLATELET - Abnormal; Notable for the following components:   WBC 15.2 (*)    Hemoglobin 17.4 (*)    Neutro Abs 10.0 (*)    Monocytes Absolute 1.9 (*)    All other components within normal limits  PROTIME-INR - Abnormal; Notable for the following components:   Prothrombin Time 19.3 (*)    INR 1.6 (*)    All other components within normal limits  URINALYSIS, ROUTINE W REFLEX  MICROSCOPIC - Abnormal; Notable for the following components:   Color, Urine AMBER (*)    Ketones, ur 80 (*)    Leukocytes,Ua MODERATE (*)    All other components within normal limits   ____________________________________________ Ordered CT HEAD New lacunar infarct in the left corona radiata, favored to be nonacute. New large right mastoid and middle ear effusions  CXR -  NON acute    ____  ECG: Ordered Personally reviewed by me showing: HR : 93  no evidence of ischemic changes QTC 441 ____________________ This patient meets SIRS Criteria and may be septic.    The recent clinical data is shown below. Vitals:   12/30/20 1945 12/30/20 2030 12/30/20 2130 12/30/20 2200  BP: 122/82 137/88 119/85 139/72  Pulse: 93 94 93 92  Resp: (!) 22 19 18  (!) 23  Temp:      TempSrc:      SpO2: 96% 97% 96% 97%  Weight:      Height:         WBC     Component Value Date/Time   WBC 15.2 (H) 12/30/2020 1645   LYMPHSABS 3.1 12/30/2020 1645   MONOABS 1.9 (H) 12/30/2020 1645   EOSABS 0.2 12/30/2020 1645   BASOSABS 0.1 12/30/2020 1645     Lactic Acid, Venous    Component Value Date/Time   LATICACIDVEN 1.6 12/30/2020 1645     Procalcitonin  Ordered    UA evidence of UTI      Urine analysis:    Component Value Date/Time   COLORURINE AMBER (A) 12/30/2020 2133   APPEARANCEUR CLEAR 12/30/2020 2133   LABSPEC 1.029 12/30/2020 2133   PHURINE 5.0 12/30/2020 2133   GLUCOSEU NEGATIVE 12/30/2020 2133   HGBUR NEGATIVE 12/30/2020 2133   BILIRUBINUR NEGATIVE 12/30/2020 2133   KETONESUR 80 (A) 12/30/2020 2133   PROTEINUR NEGATIVE 12/30/2020 2133   NITRITE NEGATIVE 12/30/2020 2133   LEUKOCYTESUR MODERATE (A) 12/30/2020 2133    Results for orders placed or performed during the hospital encounter of 03/03/18  Blood Culture (routine x 2)     Status: None   Collection Time: 03/04/18 12:04 AM   Specimen: BLOOD  Result Value Ref Range Status   Specimen Description BLOOD LEFT ANTECUBITAL  Final   Special Requests   Final    BOTTLES DRAWN AEROBIC AND ANAEROBIC Blood Culture adequate volume   Culture   Final    NO GROWTH 5 DAYS Performed at [facility], [address] [place], [place] [address]    Report Status 03/09/2018 FINAL  Final  Blood Culture (routine x 2)     Status: None   Collection Time: 03/04/18  2:01 AM   Specimen: BLOOD  Result Value Ref Range Status   Specimen Description BLOOD LEFT ANTECUBITAL  Final   Special Requests   Final    BOTTLES DRAWN AEROBIC AND ANAEROBIC Blood Culture adequate volume   Culture   Final    NO GROWTH 5 DAYS Performed at [facility], [place] [address], [place],  [address]    Report Status 03/09/2018 FINAL  Final  MRSA PCR Screening     Status: None   Collection Time: 03/04/18  3:30 AM   Specimen: Nasal Mucosa; Nasopharyngeal  Result Value Ref Range Status   MRSA by PCR NEGATIVE NEGATIVE Final    Comment:        The GeneXpert MRSA Assay (FDA approved for NASAL specimens only), is one component of a comprehensive MRSA colonization surveillance program.  It  is not intended to diagnose MRSA infection nor to guide or monitor treatment for MRSA infections. Performed at [facility], [place] [address] [place], [place] [address]      _______________________________________________ Hospitalist was called for admission for acute encephalopathy, SIRS and UTI with dehydration  The following Work up has been ordered so far:  Orders Placed This Encounter  Procedures   Blood culture (routine single)   Urine culture   DG Chest Port 1 View   CT Head Wo Contrast   Comprehensive metabolic panel   CBC WITH DIFFERENTIAL   Protime-INR   APTT   Urinalysis, Routine w reflex microscopic   Diet NPO time specified   Cardiac monitoring   Document height and weight   Assess and Document Glasgow Coma Scale   Document vital signs within 1-hour of fluid bolus completion.  Notify provider of abnormal vital signs despite fluid resuscitation.   Refer to Sidebar Report: Sepsis Bundle ED/IP   Notify provider for difficulties obtaining IV access   Initiate [place] Fluid Protocol   Do not attempt resuscitation/DNR   pharmacy consult   Consult for [facility] Medical Admission   Pulse oximetry, continuous   ED EKG 12-Lead   EKG 12-Lead     Following Medications were ordered in ER: Medications  cefTRIAXone (ROCEPHIN) 1 g in sodium chloride 0.9 % 100 mL IVPB (1 g Intravenous New Bag/Given 12/30/20 2206)  sodium chloride 0.9 % bolus 500 mL (has no administration in time range)  sodium chloride 0.9 % bolus 500 mL (0 mLs Intravenous Stopped 12/30/20 2206)     OTHER Significant initial  Findings:  labs showing:    Recent Labs  Lab 12/30/20 1645  NA 146*  K 4.3  CO2 23  GLUCOSE 137*  BUN 43*  CREATININE 1.44*  CALCIUM 8.8*    Cr    Up from baseline see below Lab Results  Component Value Date   CREATININE 1.44 (H) 12/30/2020   CREATININE 0.95 03/05/2018   CREATININE 0.85 03/04/2018    Recent Labs  Lab 12/30/20 1645  AST 41  ALT 31   ALKPHOS 126  BILITOT 1.4*  PROT 6.4*  ALBUMIN 3.2*   Lab Results  Component Value Date   CALCIUM 8.8 (L) 12/30/2020   PHOS 2.9 03/04/2018       Plt: Lab Results  Component Value Date   PLT 265 12/30/2020          Recent Labs  Lab 12/30/20 1645  WBC 15.2*  NEUTROABS 10.0*  HGB 17.4*  HCT 51.6  MCV 93.6  PLT 265    HG/HCT  Up from baseline see below    Component Value Date/Time   HGB 17.4 (H) 12/30/2020 1645   HCT 51.6 12/30/2020 1645   MCV 93.6 12/30/2020 1645     DM  labs:  HbA1C: No results for input(s): HGBA1C in the last 8760 hours.     CBG (last 3)  No results for input(s): GLUCAP in the last 72 hours.        Cultures:    Component Value Date/Time   SDES BLOOD LEFT ANTECUBITAL 03/04/2018 0201   SPECREQUEST  03/04/2018 0201    BOTTLES DRAWN AEROBIC AND ANAEROBIC Blood Culture adequate volume   CULT  03/04/2018 0201    NO GROWTH 5 DAYS Performed at [facility], [place] [address], [place], [place] [address]    REPTSTATUS 03/09/2018 FINAL 03/04/2018 0201     Radiological Exams on Admission:  CT Head Wo Contrast  Result Date: 12/30/2020 CLINICAL DATA:  Mental status change.  Unresponsive. EXAM: CT HEAD WITHOUT CONTRAST TECHNIQUE: Contiguous axial images were obtained from the base of the skull through the vertex without intravenous contrast. COMPARISON:  03/28/2019 FINDINGS: Brain: There is no evidence of an acute large territory infarct, intracranial hemorrhage, mass, midline shift, or extra-axial fluid collection. A new small focal hypodensity in the left corona radiata is compatible with a lacunar infarct and is favored to be subacute or chronic. Hypodensities elsewhere in the cerebral white matter bilaterally have not significantly changed are nonspecific but compatible with mild chronic small vessel ischemic disease. There is advanced cerebral atrophy. Vascular: Calcified atherosclerosis at the skull base. Generalized dense appearance of the  intracranial vasculature without suspicious focal finding. Skull: No acute fracture. Diffusely heterogeneous density of the skull, a chronic and nonspecific. Sinuses/Orbits: Small mucous retention cysts in the left maxillary sinus. New complete opacification of the right mastoid air cells and right tympanic cavity. Unremarkable orbits. Other: None. IMPRESSION: 1. No evidence of an acute large territory infarct or intracranial hemorrhage. 2. New lacunar infarct in the left corona radiata, favored to be nonacute. 3. Advanced cerebral atrophy and mild chronic small vessel ischemic disease. 4. New large right mastoid and middle ear effusions. Electronically Signed   By: Logan Bores M.D.   On: 12/30/2020 17:12   DG Chest Port 1 View  Result Date: 12/30/2020 CLINICAL DATA:  Question of sepsis.  Fever. EXAM: PORTABLE CHEST 1 VIEW COMPARISON:  March 03, 2018 FINDINGS: Mild opacity in left base is similar in the interval. Minimal opacity in the right base is similar in the interval. No acute infiltrate identified. No other changes. IMPRESSION: Mild bibasilar opacities are similar since August 2019, likely scarring or atelectasis. No acute infiltrates identified. Electronically Signed   By: Dorise Bullion III M.D   On: 12/30/2020 17:18   _______________________________________________________________________________________________________ Latest  Blood pressure 139/72, pulse 92, temperature 99.8 F (37.7 C), temperature source Rectal, resp. rate (!) 23, height 6' (1.829 m), weight 83.9 kg, SpO2 97 %.   Review of Systems:    Pertinent positives include:  fatigue,  confusion Constitutional:  No weight loss, night sweats, Fevers, chills, weight loss  HEENT:  No headaches, Difficulty swallowing,Tooth/dental problems,Sore throat,  No sneezing, itching, ear ache, nasal congestion, post nasal drip,  Cardio-vascular:  No chest pain, Orthopnea, PND, anasarca, dizziness, palpitations.no Bilateral lower extremity  swelling  GI:  No heartburn, indigestion, abdominal pain, nausea, vomiting, diarrhea, change in bowel habits, loss of appetite, melena, blood in stool, hematemesis Resp:  no shortness of breath at rest. No dyspnea on exertion, No excess mucus, no productive cough, No non-productive cough, No coughing up of blood.No change in color of mucus.No wheezing. Skin:  no rash or lesions. No jaundice GU:  no dysuria, change in color of urine, no urgency or frequency. No straining to urinate.  No flank pain.  Musculoskeletal:  No joint pain or no joint swelling. No decreased range of motion. No back pain.  Psych:  No change in mood or affect. No depression or anxiety. No memory loss.  Neuro: no localizing neurological complaints, no tingling, no weakness, no double vision, no gait abnormality, no slurred speech, no   All systems reviewed and apart from [place] all are negative _______________________________________________________________________________________________ Past Medical History:   Past Medical History:  Diagnosis Date   Brain injury ([place])    Brain tumor ([place])       History reviewed.  No pertinent surgical history.  Social History:  Ambulatory  bed bound     reports that he has never smoked. He has never used smokeless tobacco. He reports previous alcohol use. No history on file for drug use.   Family History:   Family History  Problem Relation Age of Onset   Hypertension Other    ______________________________________________________________________________________________ Allergies: No Known Allergies   Prior to Admission medications   Medication Sig Start Date End Date Taking? Authorizing Provider  atorvastatin (LIPITOR) 20 MG tablet Take 20 mg by mouth daily.   Yes [provider]  busPIRone (BUSPAR) 10 MG tablet Take 1 tablet (10 mg total) by mouth 2 (two) times daily. Patient taking differently: Take 10 mg by mouth 3 (three) times daily. 03/05/18  Yes  Hall, Carole N, DO  citalopram (CELEXA) 10 MG tablet Take 10 mg by mouth daily.   Yes [provider]  glipiZIDE (GLUCOTROL) 5 MG tablet Take 5 mg by mouth daily before breakfast.   Yes [provider]  Levetiracetam 750 MG TB24 Take 3,000 mg by mouth daily.    Yes [provider]  lisinopril (PRINIVIL,ZESTRIL) 10 MG tablet Take 10 mg by mouth daily.   Yes [provider]  metFORMIN (GLUCOPHAGE) 500 MG tablet Take 500 mg by mouth daily.   Yes [provider]  Multiple Vitamins-Minerals (PRESERVISION/LUTEIN PO) Take 1 capsule by mouth 2 (two) times daily.   Yes [provider]  phenytoin (DILANTIN) 100 MG ER capsule Take 300 mg by mouth daily.   Yes [provider]  polyethylene glycol (MIRALAX / GLYCOLAX) packet Take 17 g by mouth daily.   Yes [provider]  polyvinyl alcohol (LIQUIFILM TEARS) 1.4 % ophthalmic solution Place 1 drop into both eyes 3 (three) times daily.   Yes [provider]  QUEtiapine (SEROQUEL XR) 50 MG TB24 24 hr tablet Take 1 tablet (50 mg total) by mouth daily. Patient taking differently: Take 50 mg by mouth daily. 9 am 11/08/17  Yes Patrecia Pour, NP  QUEtiapine (SEROQUEL) 25 MG tablet Take 25 mg by mouth at bedtime.   Yes [provider]  Vitamin D, Ergocalciferol, (DRISDOL) 1.25 MG (50000 UNIT) CAPS capsule Take 50,000 Units by mouth every 30 (thirty) days.   Yes [provider]  multivitamin-lutein (OCUVITE-LUTEIN) CAPS capsule Take 1 capsule by mouth 2 (two) times daily. PRESERVISION Patient not taking: Reported on 12/30/2020 03/05/18   Kayleen Memos, DO    ___________________________________________________________________________________________________ Physical Exam: Vitals with BMI 12/30/2020 12/30/2020 12/30/2020  Height - - -  Weight - - -  BMI - - -  Systolic 893 810 175  Diastolic 72 85 88  Pulse 92 93 94     1. General:  in No Acute distress    Chronically  ill   -appearing 2. Psychological: obtunded not oriented 3. Head/ENT:   Dry Mucous Membranes                          Head Non traumatic, neck supple                          Poor Dentition 4. SKIN:  decreased Skin turgor,  Skin clean Dry and intact no rash 5. Heart: Regular rate and rhythm no Murmur, no Rub or gallop 6. Lungs:  no wheezes or crackles   7. Abdomen: Soft,  non-tender, Non distended  bowel sounds  present 8. Lower extremities: no clubbing, cyanosis, no  edema 9. Neurologically withdraws to pain and squeezes hands when requested 10. MSK: Normal range of motion    Chart has been reviewed  ______________________________________________________________________________________________  Assessment/Plan  70 y.o. male with medical history significant of brain tumor very hard of hearing.   Seizure disorder, dementia, HLD, diabetes mellitus   Admitted for acute encephalopathy, SIRS and UTI with dehydration   Present on Admission:  SIRS (systemic inflammatory response syndrome) ([place]) -  -SIRS criteria met with  elevated white blood cell count,       Component Value Date/Time   WBC 15.2 (H) 12/30/2020 1645   LYMPHSABS 3.1 12/30/2020 1645    RR >20 Today's Vitals   12/30/20 1945 12/30/20 2030 12/30/20 2130 12/30/20 2200  BP: 122/82 137/88 119/85 139/72  Pulse: 93 94 93 92  Resp: (!) 22 19 18  (!) 23  Temp:      TempSrc:      SpO2: 96% 97% 96% 97%  Weight:      Height:      PainSc:          -Most likely source being: urinary,    Patient meeting criteria for Severe sepsis with  acute metabolic encephalopathy   - Obtain serial lactic acid and procalcitonin level.  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  Discussed with neurology.    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  DVT prophylaxis:  SCD      Code Status:    Code Status: DNR     DNR/DNI   as per  family as per ER MD discussion     Family Communication:   Family not at  Bedside    Disposition Plan:                              Back to current facility when stable     Following barriers for discharge:                            Electrolytes corrected  white count improving able to transition to PO antibiotics                             Will need to be able to tolerate PO                                                  Will need consultants to evaluate patient prior to discharge                     Would benefit from PT/OT eval prior to DC  Ordered                   Swallow eval - SLP ordered                   Diabetes care coordinator                   Transition of care consulted                   Nutrition    consulted                  Wound care  consulted                   Palliative care    consulted                      Consults called:  none  Admission  status:  ED Disposition     ED Disposition  [place]: [place] [[id number]]  Level of Care: Telemetry Medical [104]  May place patient in observation at [facility] or [facility] if equivalent level of care is available:: No  Covid Evaluation: Asymptomatic Screening Protocol (No Symptoms)  Diagnosis: SIRS (systemic inflammatory response syndrome) ([place]) [950722]  Admitting Physician: Toy Baker [[id number]]  Attending Physician: Toy Baker [[id number]]           Obs     Level of care    tele  For 12H  COVID-19 Labs   No results found for: SARSCOV2NAA   Precautions: admitted as asymptomatic screening protocol    PPE: Used by the provider:   N95  eye Goggles,  Gloves   Estrellita Lasky 12/31/2020, 12:04 AM    [facility]     after 2 AM please page floor coverage PA If 7AM-7PM, please contact the day team taking care of the patient using [email or web address]   Patient was evaluated in the context of the global COVID-19 pandemic, which necessitated consideration that the patient might be at risk for infection with the SARS-CoV-2 virus that causes COVID-19. Institutional protocols and algorithms that pertain to the evaluation of patients at risk for COVID-19 are in a state of rapid change based on information released by regulatory bodies including the CDC and federal and state organizations. These policies and algorithms were followed during the patient's care.

## 2020-12-30 NOTE — ED Triage Notes (Signed)
Coming from [facility] home. Patient has been unresponsive since about 0800. Patient has had a decreased appetite for last few days, othere than that nothing out of the normal. HR 120, BS132. Temperature 100.5 on EMS arrival. Patient responds to pain, however nothing to voice at this time. Sacral wound/ulcer has been being treated for last week. No signs of infection noted. COVID negative.

## 2020-12-30 NOTE — ED Notes (Addendum)
Patient very hard of hearing. Was able to respond to sister via whiteboard up until a month ago. TBI from brain tumor. Patient has been slowly declining over the last 6 weeks. Patient does have hx of seizures. Patient is non-ambulatory. Apparently does not get out of bed anymore at this time.

## 2020-12-30 NOTE — ED Provider Notes (Signed)
[facility] EMERGENCY DEPARTMENT Provider Note   CSN: [id number] Arrival date & time: 12/30/20  1637     History Chief Complaint  Patient presents with   Altered Mental Status    Hector Patel is a 70 y.o. male.  HPI Patient presents from nursing facility with staff concern for altered mental status.  Patient has multiple medical issues including dementia, seizure disorder.  Level 5 caveat secondary to the patient's cognitive impairment at baseline. Reportedly the patient was last seen normal 9 hours prior to ED transfer.  Seemingly the patient took his medication as appropriate earlier in the day, but since that time has been essentially noninteractive.  He is typically interactive, though his dementia is noted. EMS notes that 1 week ago he developed a decubitus ulcer, reportedly stage I.  He is reportedly receiving wound care for this.  Today the patient was noninteractive during transfer, was initially mildly hypotensive but this improved with fluid resuscitation.     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     No family history on file.  Social History   Tobacco Use   Smoking status: Never    Home Medications Prior to Admission medications   Medication Sig Start Date End Date Taking? Authorizing Provider  atorvastatin (LIPITOR) 20 MG tablet Take 20 mg by mouth daily.   Yes [provider]  busPIRone (BUSPAR) 10 MG tablet Take 1 tablet (10 mg total) by mouth 2 (two) times daily. Patient  taking differently: Take 10 mg by mouth 3 (three) times daily. 03/05/18  Yes Hall, Carole N, DO  citalopram (CELEXA) 10 MG tablet Take 10 mg by mouth daily.   Yes [provider]  glipiZIDE (GLUCOTROL) 5 MG tablet Take 5 mg by mouth daily before breakfast.   Yes [provider]  Levetiracetam 750 MG TB24 Take 3,000 mg by mouth daily.    Yes [provider]  lisinopril (PRINIVIL,ZESTRIL) 10 MG tablet Take 10 mg by mouth daily.   Yes [provider]  metFORMIN (GLUCOPHAGE) 500 MG tablet Take 500 mg by mouth daily.   Yes [provider]  Multiple Vitamins-Minerals (PRESERVISION/LUTEIN PO) Take 1 capsule by mouth 2 (two) times daily.   Yes [provider]  phenytoin (DILANTIN) 100 MG ER capsule Take 300 mg by mouth daily.   Yes [provider]  polyethylene glycol (MIRALAX / GLYCOLAX) packet Take 17 g by mouth daily.   Yes [provider]  polyvinyl alcohol (LIQUIFILM TEARS) 1.4 % ophthalmic solution Place 1 drop into both eyes 3 (three) times daily.   Yes [provider]  QUEtiapine (SEROQUEL XR) 50 MG TB24 24 hr tablet Take 1 tablet (50 mg total) by mouth daily. Patient taking differently: Take 50 mg by mouth daily. 9 am 11/08/17  Yes Patrecia Pour, NP  QUEtiapine (SEROQUEL) 25 MG tablet Take 25 mg by mouth at bedtime.   Yes [provider]  Vitamin D, Ergocalciferol, (DRISDOL) 1.25 MG (50000 UNIT) CAPS capsule Take 50,000 Units by mouth  every 30 (thirty) days.   Yes [provider]  multivitamin-lutein (OCUVITE-LUTEIN) CAPS capsule Take 1 capsule by mouth 2 (two) times daily. PRESERVISION Patient not taking: Reported on 12/30/2020 03/05/18   Kayleen Memos, DO    Allergies    Patient has no known allergies.  Review of Systems   Review of Systems  Unable to perform ROS: Dementia   Physical Exam Updated Vital Signs BP 119/85   Pulse 93   Temp 99.8 F (37.7 C) (Rectal)   Resp 18   Ht 6' (1.829  m)   Wt 83.9 kg   SpO2 96%   BMI 25.09 kg/m   Physical Exam Vitals and nursing note reviewed.  Constitutional:      General: He is not in acute distress.    Appearance: He is well-developed. He is ill-appearing. He is not toxic-appearing.     Comments: Noninteractive elderly male  HENT:     Head: Normocephalic and atraumatic.  Eyes:     Conjunctiva/sclera: Conjunctivae normal.  Cardiovascular:     Rate and Rhythm: Regular rhythm. Tachycardia present.  Pulmonary:     Effort: Pulmonary effort is normal. No respiratory distress.     Breath sounds: No stridor.  Abdominal:     General: There is no distension.  Skin:    General: Skin is warm and dry.     Comments: Grade 1 decub w erythema, no full dermal depth.  Neurological:     Mental Status: He is alert.     Comments: Patient moves all extremities minimally, spontaneously, and responds to pain. Areflexic, lower extremities distally.  Psychiatric:        Cognition and Memory: Cognition is impaired. Memory is impaired.    ED Results / Procedures / Treatments   Labs (all labs ordered are listed, but only abnormal results are displayed) Labs Reviewed  COMPREHENSIVE METABOLIC PANEL - Abnormal; Notable for the following components:      Result Value   Sodium 146 (*)    Chloride 114 (*)    Glucose, Bld 137 (*)    BUN 43 (*)    Creatinine, Ser 1.44 (*)    Calcium 8.8 (*)    Total Protein 6.4 (*)    Albumin 3.2 (*)    Total Bilirubin 1.4 (*)    GFR, Estimated 52 (*)    All other components within normal limits  CBC WITH DIFFERENTIAL/PLATELET - Abnormal; Notable for the following components:   WBC 15.2 (*)    Hemoglobin 17.4 (*)    Neutro Abs 10.0 (*)    Monocytes Absolute 1.9 (*)    All other components within normal limits  PROTIME-INR - Abnormal; Notable for the following components:   Prothrombin Time 19.3 (*)    INR 1.6 (*)    All other components within normal limits  CULTURE, BLOOD (SINGLE)  URINE CULTURE   LACTIC ACID, PLASMA  APTT  URINALYSIS, ROUTINE W REFLEX MICROSCOPIC   EMS rhythm strip with sinus tachycardia, rate 112, left axis deviation, abnormal  EKG EKG Interpretation  Date/Time:  Sunday December 30 2020 18:00:56 EDT Ventricular Rate:  93 PR Interval:  148 QRS Duration: 89 QT Interval:  354 QTC Calculation: 441 R Axis:   -38 Text Interpretation: Sinus rhythm Inferior infarct, old Consider anterior infarct Abnormal ECG Confirmed by Carmin Muskrat [phone]) on 12/30/2020 7:02:12 PM  Radiology CT Head Wo Contrast  Result Date: 12/30/2020 CLINICAL DATA:  Mental status change.  Unresponsive. EXAM: CT HEAD WITHOUT CONTRAST TECHNIQUE:  Contiguous axial images were obtained from the base of the skull through the vertex without intravenous contrast. COMPARISON:  03/28/2019 FINDINGS: Brain: There is no evidence of an acute large territory infarct, intracranial hemorrhage, mass, midline shift, or extra-axial fluid collection. A new small focal hypodensity in the left corona radiata is compatible with a lacunar infarct and is favored to be subacute or chronic. Hypodensities elsewhere in the cerebral white matter bilaterally have not significantly changed are nonspecific but compatible with mild chronic small vessel ischemic disease. There is advanced cerebral atrophy. Vascular: Calcified atherosclerosis at the skull base. Generalized dense appearance of the intracranial vasculature without suspicious focal finding. Skull: No acute fracture. Diffusely heterogeneous density of the skull, a chronic and nonspecific. Sinuses/Orbits: Small mucous retention cysts in the left maxillary sinus. New complete opacification of the right mastoid air cells and right tympanic cavity. Unremarkable orbits. Other: None. IMPRESSION: 1. No evidence of an acute large territory infarct or intracranial hemorrhage. 2. New lacunar infarct in the left corona radiata, favored to be nonacute. 3. Advanced cerebral atrophy and mild  chronic small vessel ischemic disease. 4. New large right mastoid and middle ear effusions. Electronically Signed   By: Logan Bores M.D.   On: 12/30/2020 17:12   DG Chest Port 1 View  Result Date: 12/30/2020 CLINICAL DATA:  Question of sepsis.  Fever. EXAM: PORTABLE CHEST 1 VIEW COMPARISON:  March 03, 2018 FINDINGS: Mild opacity in left base is similar in the interval. Minimal opacity in the right base is similar in the interval. No acute infiltrate identified. No other changes. IMPRESSION: Mild bibasilar opacities are similar since August 2019, likely scarring or atelectasis. No acute infiltrates identified. Electronically Signed   By: Dorise Bullion III M.D   On: 12/30/2020 17:18    Procedures Procedures   Medications Ordered in ED Medications  sodium chloride 0.9 % bolus 500 mL (500 mLs Intravenous New Bag/Given 12/30/20 2126)    ED Course  I have reviewed the triage vital signs and the nursing notes.  Pertinent labs & imaging results that were available during my care of the patient were reviewed by me and considered in my medical decision making (see chart for details).  Patient meets SIRS criteria on arrival, but no obvious source.  Some consideration of erythema around his decubitus ulcer/cellulitis.   Update:, Patient in similar condition.  He has received fluids, heart rate has diminished.  9:49 PM I discussed the patient's presentation with his sister via telephone.  We discussed his history of TBI, hearing deficiency, dementia.  She notes that until about 1 month ago the patient was much more interactive.  His hearing deficiency he required conversation with whiteboard, but was able to speak and interact.  That has changed substantially over the past month and he has had substantial decline in his condition. She is amenable to palliative care consult, and conversation about hospice discussion eventually was conducted as well. Patient's findings thus far notable for  leukocytosis.  Though his decubitus ulcer is not full dermal depth, it is slightly red and this may be a source for his systemic inflammatory response syndrome with delirium.  Additional considerations include dehydration with acute kidney injury demonstrated on labs.  Urinalysis pending, likely secondary to the patient's dehydration, as above.  Patient received ceftriaxone fluid resuscitation, but is not hypotensive, or in shock.  However, given concern for delirium, AKI, SIRS, he will be admitted for further monitoring, management with anticipated palliative care consult tomorrow. MDM Rules/Calculators/A&P MDM Number  of Diagnoses or Management Options AKI (acute kidney injury) ([place]): new, needed workup Delirium: new, needed workup SIRS (systemic inflammatory response syndrome) ([place]): new, needed workup   Amount and/or Complexity of Data Reviewed Clinical lab tests: ordered and reviewed Tests in the radiology section of CPT: ordered and reviewed Tests in the medicine section of CPT: reviewed and ordered Decide to obtain previous medical records or to obtain history from someone other than the patient: yes Obtain history from someone other than the patient: yes Review and summarize past medical records: yes Discuss the patient with other providers: yes Independent visualization of images, tracings, or specimens: yes  Risk of Complications, Morbidity, and/or Mortality Presenting problems: high Diagnostic procedures: high Management options: high  Critical Care Total time providing critical care: 30-74 minutes (35)  Patient Progress Patient progress: stable   Final Clinical Impression(s) / ED Diagnoses Final diagnoses:  Delirium  AKI (acute kidney injury) ([place])  SIRS (systemic inflammatory response syndrome) ([place])     Carmin Muskrat, MD 12/30/20 2151

## 2020-12-31 ENCOUNTER — Observation Stay (HOSPITAL_COMMUNITY): Payer: Medicare PPO

## 2020-12-31 DIAGNOSIS — N390 Urinary tract infection, site not specified: Secondary | ICD-10-CM | POA: Diagnosis not present

## 2020-12-31 DIAGNOSIS — S069X0A Unspecified intracranial injury without loss of consciousness, initial encounter: Secondary | ICD-10-CM | POA: Diagnosis not present

## 2020-12-31 DIAGNOSIS — N179 Acute kidney failure, unspecified: Secondary | ICD-10-CM | POA: Diagnosis not present

## 2020-12-31 DIAGNOSIS — E860 Dehydration: Secondary | ICD-10-CM | POA: Diagnosis not present

## 2020-12-31 DIAGNOSIS — Z515 Encounter for palliative care: Principal | ICD-10-CM

## 2020-12-31 LAB — COMPREHENSIVE METABOLIC PANEL
ALT: 29 U/L (ref 0–44)
AST: 46 U/L — ABNORMAL HIGH (ref 15–41)
Albumin: 3.1 g/dL — ABNORMAL LOW (ref 3.5–5.0)
Alkaline Phosphatase: 119 U/L (ref 38–126)
Anion gap: 6 (ref 5–15)
BUN: 38 mg/dL — ABNORMAL HIGH (ref 8–23)
CO2: 24 mmol/L (ref 22–32)
Calcium: 8.7 mg/dL — ABNORMAL LOW (ref 8.9–10.3)
Chloride: 117 mmol/L — ABNORMAL HIGH (ref 98–111)
Creatinine, Ser: 1.04 mg/dL (ref 0.61–1.24)
GFR, Estimated: >60 mL/min (ref >60)
Glucose, Bld: 117 mg/dL — ABNORMAL HIGH (ref 70–99)
Potassium: 4.6 mmol/L (ref 3.5–5.1)
Sodium: 147 mmol/L — ABNORMAL HIGH (ref 135–145)
Total Bilirubin: 1.2 mg/dL (ref 0.3–1.2)
Total Protein: 6.2 g/dL — ABNORMAL LOW (ref 6.5–8.1)

## 2020-12-31 LAB — CBC WITH DIFFERENTIAL/PLATELET
Abs Immature Granulocytes: 0.05 10*3/uL (ref 0.00–0.07)
Abs Immature Granulocytes: 0.08 10*3/uL — ABNORMAL HIGH (ref 0.00–0.07)
Basophils Absolute: 0 10*3/uL (ref 0.0–0.1)
Basophils Absolute: 0 10*3/uL (ref 0.0–0.1)
Basophils Relative: 0 %
Basophils Relative: 0 %
Eosinophils Absolute: 0.2 10*3/uL (ref 0.0–0.5)
Eosinophils Absolute: 0.4 10*3/uL (ref 0.0–0.5)
Eosinophils Relative: 2 %
Eosinophils Relative: 3 %
HCT: 53.1 % — ABNORMAL HIGH (ref 39.0–52.0)
HCT: 55.2 % — ABNORMAL HIGH (ref 39.0–52.0)
Hemoglobin: 17.5 g/dL — ABNORMAL HIGH (ref 13.0–17.0)
Hemoglobin: 17.4 g/dL — ABNORMAL HIGH (ref 13.0–17.0)
Immature Granulocytes: 0 %
Immature Granulocytes: 1 %
Lymphocytes Relative: 23 %
Lymphocytes Relative: 21 %
Lymphs Abs: 2.9 10*3/uL (ref 0.7–4.0)
Lymphs Abs: 3.1 10*3/uL (ref 0.7–4.0)
MCH: 31.1 pg (ref 26.0–34.0)
MCH: 31.2 pg (ref 26.0–34.0)
MCHC: 33 g/dL (ref 30.0–36.0)
MCHC: 31.5 g/dL (ref 30.0–36.0)
MCV: 94.5 fL (ref 80.0–100.0)
MCV: 99.1 fL (ref 80.0–100.0)
Monocytes Absolute: 1.2 10*3/uL — ABNORMAL HIGH (ref 0.1–1.0)
Monocytes Absolute: 1.4 10*3/uL — ABNORMAL HIGH (ref 0.1–1.0)
Monocytes Relative: 10 %
Monocytes Relative: 10 %
Neutro Abs: 8.3 10*3/uL — ABNORMAL HIGH (ref 1.7–7.7)
Neutro Abs: 9.6 10*3/uL — ABNORMAL HIGH (ref 1.7–7.7)
Neutrophils Relative %: 65 %
Neutrophils Relative %: 65 %
Platelets: 193 10*3/uL (ref 150–400)
RBC: 5.62 MIL/uL (ref 4.22–5.81)
RBC: 5.57 MIL/uL (ref 4.22–5.81)
RDW: 12.1 % (ref 11.5–15.5)
RDW: 12.1 % (ref 11.5–15.5)
WBC: 12.8 10*3/uL — ABNORMAL HIGH (ref 4.0–10.5)
WBC: 14.6 10*3/uL — ABNORMAL HIGH (ref 4.0–10.5)
nRBC: 0 % (ref 0.0–0.2)
nRBC: 0 % (ref 0.0–0.2)

## 2020-12-31 LAB — CBG MONITORING, ED
Glucose-Capillary: 106 mg/dL — ABNORMAL HIGH (ref 70–99)
Glucose-Capillary: 114 mg/dL — ABNORMAL HIGH (ref 70–99)
Glucose-Capillary: 109 mg/dL — ABNORMAL HIGH (ref 70–99)

## 2020-12-31 LAB — LIPID PANEL
Cholesterol: 159 mg/dL (ref 0–200)
HDL: 25 mg/dL — ABNORMAL LOW (ref >40)
LDL Cholesterol: 101 mg/dL — ABNORMAL HIGH (ref 0–99)
Total CHOL/HDL Ratio: 6.4 RATIO
Triglycerides: 164 mg/dL — ABNORMAL HIGH (ref <150)
VLDL: 33 mg/dL (ref 0–40)

## 2020-12-31 LAB — SARS CORONAVIRUS 2 (TAT 6-24 HRS): SARS Coronavirus 2: NEGATIVE

## 2020-12-31 LAB — MAGNESIUM: Magnesium: 2.1 mg/dL (ref 1.7–2.4)

## 2020-12-31 LAB — GLUCOSE, CAPILLARY: Glucose-Capillary: 118 mg/dL — ABNORMAL HIGH (ref 70–99)

## 2020-12-31 LAB — TSH: TSH: 0.834 u[IU]/mL (ref 0.350–4.500)

## 2020-12-31 LAB — PHOSPHORUS: Phosphorus: 3.7 mg/dL (ref 2.5–4.6)

## 2020-12-31 MED ORDER — GLYCOPYRROLATE 0.2 MG/ML IJ SOLN: 0.2000 mg | INTRAMUSCULAR | Status: DC | PRN

## 2020-12-31 MED ORDER — HALOPERIDOL LACTATE 5 MG/ML IJ SOLN: 0.5000 mg | INTRAMUSCULAR | Status: DC | PRN

## 2020-12-31 MED ORDER — ONDANSETRON HCL 4 MG/2ML IJ SOLN: 4.0000 mg | Freq: Four times a day (QID) | INTRAMUSCULAR | Status: DC | PRN

## 2020-12-31 MED ORDER — POLYVINYL ALCOHOL 1.4 % OP SOLN: 1.0000 [drp] | Freq: Four times a day (QID) | OPHTHALMIC | Status: DC | PRN

## 2020-12-31 MED ORDER — MORPHINE SULFATE (PF) 2 MG/ML IV SOLN: 1.0000 mg | INTRAVENOUS | Status: DC | PRN

## 2020-12-31 MED ORDER — HALOPERIDOL LACTATE 2 MG/ML PO CONC: 0.5000 mg | ORAL | Status: DC | PRN

## 2020-12-31 MED ORDER — BIOTENE DRY MOUTH MT LIQD: 15.0000 mL | OROMUCOSAL | Status: DC | PRN

## 2020-12-31 MED ORDER — LORAZEPAM 1 MG PO TABS: 1.0000 mg | ORAL_TABLET | ORAL | Status: DC | PRN

## 2020-12-31 MED ORDER — LORAZEPAM 2 MG/ML IJ SOLN
1.0000 mg | INTRAMUSCULAR | Status: DC | PRN
  Administered 2020-12-31 – 2021-01-01 (×2): 1 mg via INTRAVENOUS
  Filled 2020-12-31 (×2): qty 1

## 2020-12-31 MED ORDER — ONDANSETRON 4 MG PO TBDP: 4.0000 mg | ORAL_TABLET | Freq: Four times a day (QID) | ORAL | Status: DC | PRN

## 2020-12-31 MED ORDER — HALOPERIDOL 0.5 MG PO TABS: 0.5000 mg | ORAL_TABLET | ORAL | Status: DC | PRN

## 2020-12-31 MED ORDER — GLYCOPYRROLATE 1 MG PO TABS: 1.0000 mg | ORAL_TABLET | ORAL | Status: DC | PRN

## 2020-12-31 MED ORDER — HALOPERIDOL LACTATE 5 MG/ML IJ SOLN: 5.0000 mg | Freq: Four times a day (QID) | INTRAMUSCULAR | Status: DC | PRN

## 2020-12-31 MED ORDER — SODIUM CHLORIDE 0.45 % IV SOLN: INTRAVENOUS | Status: DC

## 2020-12-31 MED ORDER — ATORVASTATIN CALCIUM 40 MG PO TABS: 40.0000 mg | ORAL_TABLET | Freq: Every day | ORAL | Status: DC

## 2020-12-31 MED ORDER — LORAZEPAM 2 MG/ML PO CONC: 1.0000 mg | ORAL | Status: DC | PRN

## 2020-12-31 NOTE — ED Notes (Signed)
Pt unable to walk at this time

## 2020-12-31 NOTE — Progress Notes (Signed)
PROGRESS NOTE    Hector Patel  JGG:[id number] DOB: 1951/01/05 DOA: 12/30/2020 PCP: Patient, No Pcp Per (Inactive)   Brief Narrative: 70 year old male with a past medical history significant for brain tumor, very hard of hearing, seizure disorder, dementia, hyperlipidemia and diabetes mellitus.  Patient presented to the emergency department from facility [facility].  Presented for altered mental status/decreased responsiveness.  Apparently at baseline he is making eye contact and more interactive.  Last seen normal 9 hours prior to ED presentation.  He has been progressively declining for the past 6 months.  No longer ambulatory actually bedbound now.  He has a sacral decubitus ulcer that is stage I/II upon my exam.  Does not appear infected.  Initially on arrival to the emergency department was hypotensive but responded with IV fluid resuscitation.  COVID-19 testing was negative.  Admitted for sepsis secondary to complicated urinary tract infection.  Noted on antibiotics with blood cultures and urine cultures pending.  CT imaging of the head was negative for any acute findings.  The plan was to obtain an MRI to rule out any CVA family declined MRI imaging.  Palliative care was consulted.  Patient is DNR.  Family has elected to pursue comfort measures only.   Subjective: Upon my exam he is awake but not interactive.  Does not really follow much commands.  Very difficult to assess if this is due to his severe hearing loss or his altered mental status.  Does appear to be in any pain.   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  Does not appear infected  Diabetes mellitus -No acute tx  Hyperlipidemia -no acute tx  Dementia -no acute tx  DVT prophylaxis: None Code Status: DNR Family Communication: Family was not present upon my evaluation at bedside. Disposition:   Status is: Observation  The patient remains OBS appropriate and will d/c before 2 midnights.  Dispo: The patient is from: snf hospice vs hospital death, vs home hospice              Anticipated d/c is to: SNF              Patient currently is not medically stable to d/c.   Difficult to place patient No        Consultants:  Palliative care     Objective: Vitals:   12/31/20 0402 12/31/20 0500 12/31/20 0700 12/31/20 1000  BP:  139/85 128/80 135/86  Pulse:  80 83 91  Resp:  14 20 12   Temp: 97.7 F (36.5 C)     TempSrc: Axillary     SpO2:  97% 97% 95%  Weight:      Height:       No intake or output data in the 24  hours ending 12/31/20 1259 Filed Weights   12/30/20 1645  Weight: 83.9 kg    Examination:  General exam: Appears calm and comfortably.  Chronically ill-appearing Respiratory system: Clear to auscultation. Respiratory effort normal. Cardiovascular system: S1 & S2 heard, RRR. No JVD, murmurs, rubs, gallops or clicks. No pedal edema. Gastrointestinal system: Abdomen is nondistended, soft and nontender. No organomegaly or masses felt. Normal bowel sounds heard. Central nervous system: Awake. Extremities: No edema Skin: No rashes, lesions or ulcers Psychiatry: Does not respond to verbal stimuli or  follow simple commands    Data Reviewed: I have personally reviewed following labs and imaging studies  CBC: Recent Labs  Lab 12/30/20 1645 12/31/20 0402 12/31/20 0616  WBC 15.2* 12.8* 14.6*  NEUTROABS 10.0* 8.3* 9.6*  HGB 17.4* 17.5* 17.4*  HCT 51.6 53.1* 55.2*  MCV 93.6 94.5 99.1  PLT 265 SPECIMEN IS CLOTTED 981    Basic Metabolic Panel: Recent Labs  Lab 12/30/20 1645 12/31/20 0402  NA 146* 147*  K 4.3 4.6  CL 114* 117*  CO2 23 24  GLUCOSE 137* 117*  BUN 43* 38*  CREATININE 1.44* 1.04  CALCIUM 8.8* 8.7*  MG  --  2.1  PHOS  --  3.7    GFR: Estimated Creatinine Clearance: 70.5 mL/min (by C-G formula based on SCr of 1.04 mg/dL).  Liver Function Tests: Recent Labs  Lab 12/30/20 1645 12/31/20 0402  AST 41 46*  ALT 31 29  ALKPHOS 126 119  BILITOT 1.4* 1.2  PROT 6.4* 6.2*  ALBUMIN 3.2* 3.1*    CBG: Recent Labs  Lab 12/31/20 0351 12/31/20 0805 12/31/20 1131  GLUCAP 106* 114* 109*     Recent Results (from the past 240 hour(s))  Blood culture (routine single)     Status: None (Preliminary result)   Collection Time: 12/30/20  4:45 PM   Specimen: BLOOD LEFT ARM  Result Value Ref Range Status   Specimen Description BLOOD LEFT ARM  Final   Special Requests   Final    BOTTLES DRAWN AEROBIC AND ANAEROBIC Blood Culture adequate volume   Culture   Final    NO GROWTH < 12 HOURS Performed at [facility], [place] [address], [place], [place] [address]    Report Status PENDING  Incomplete  SARS CORONAVIRUS 2 (TAT 6-24 HRS) Nasopharyngeal Nasopharyngeal Swab     Status: None   Collection Time: 12/30/20  4:51 PM   Specimen: Nasopharyngeal Swab  Result Value Ref Range Status   SARS Coronavirus 2 NEGATIVE NEGATIVE Final    Comment: (NOTE) SARS-CoV-2 target nucleic acids are NOT DETECTED.  The SARS-CoV-2 RNA is generally detectable in upper and lower respiratory specimens during the acute phase of infection. Negative results do not preclude SARS-CoV-2  infection, do not rule out co-infections with other pathogens, and should not be used as the sole basis for treatment or other patient management decisions. Negative results must be combined with clinical observations, patient history, and epidemiological information. The expected result is Negative.  Fact Sheet for Patients: SugarRoll.be  Fact Sheet for Healthcare Providers: https://www.woods-mathews.com/  This test is not yet approved or cleared by the [place] FDA and  has been authorized for detection and/or diagnosis of SARS-CoV-2 by FDA under an Emergency Use Authorization (EUA). This EUA will remain  in effect (meaning this test can be used) for the duration of the COVID-19 declaration under Se ction 564(b)(1) of the Act, 21 U.S.C. section 360bbb-3(b)(1), unless the authorization is terminated or revoked sooner.  Performed at [facility], [place] [address], [place],  [address]          Radiology Studies: DG Abd 1 View  Result Date: 12/31/2020 CLINICAL DATA:  Unresponsive EXAM: ABDOMEN - 1 VIEW COMPARISON:  None. FINDINGS: The bowel gas pattern is normal. No radio-opaque calculi or other significant radiographic abnormality are seen. IMPRESSION: Negative. Electronically Signed   By: Rolm Baptise M.D.   On: 12/31/2020 01:18   CT Head Wo Contrast  Result Date: 12/30/2020 CLINICAL DATA:  Mental status change.  Unresponsive. EXAM: CT HEAD WITHOUT CONTRAST TECHNIQUE: Contiguous axial images were obtained from the base of the skull through the vertex without intravenous contrast. COMPARISON:  03/28/2019 FINDINGS: Brain: There is no evidence of an acute large territory infarct, intracranial hemorrhage, mass, midline shift, or extra-axial fluid collection. A new small focal hypodensity in the left corona radiata is compatible with a lacunar infarct and is favored to be subacute or chronic. Hypodensities elsewhere in the  cerebral white matter bilaterally have not significantly changed are nonspecific but compatible with mild chronic small vessel ischemic disease. There is advanced cerebral atrophy. Vascular: Calcified atherosclerosis at the skull base. Generalized dense appearance of the intracranial vasculature without suspicious focal finding. Skull: No acute fracture. Diffusely heterogeneous density of the skull, a chronic and nonspecific. Sinuses/Orbits: Small mucous retention cysts in the left maxillary sinus. New complete opacification of the right mastoid air cells and right tympanic cavity. Unremarkable orbits. Other: None. IMPRESSION: 1. No evidence of an acute large territory infarct or intracranial hemorrhage. 2. New lacunar infarct in the left corona radiata, favored to be nonacute. 3. Advanced cerebral atrophy and mild chronic small vessel ischemic disease. 4. New large right mastoid and middle ear effusions. Electronically Signed   By: Logan Bores M.D.   On: 12/30/2020 17:12   DG Chest Port 1 View  Result Date: 12/30/2020 CLINICAL DATA:  Question of sepsis.  Fever. EXAM: PORTABLE CHEST 1 VIEW COMPARISON:  March 03, 2018 FINDINGS: Mild opacity in left base is similar in the interval. Minimal opacity in the right base is similar in the interval. No acute infiltrate identified. No other changes. IMPRESSION: Mild bibasilar opacities are similar since August 2019, likely scarring or atelectasis. No acute infiltrates identified. Electronically Signed   By: Dorise Bullion III M.D   On: 12/30/2020 17:18        Scheduled Meds:  busPIRone  10 mg Oral TID   docusate sodium  100 mg Oral BID   phenytoin (DILANTIN) IV  100 mg Intravenous Q8H   polyethylene glycol  17 g Oral Daily   QUEtiapine  50 mg Oral Daily   QUEtiapine  25 mg Oral QHS   Continuous Infusions:  levETIRAcetam Stopped (12/31/20 0126)     LOS: 0 days    Time spent: 35-minute    Leslee Home, MD [facility]   To contact the  attending provider between 7A-7P or the covering provider during after hours 7P-7A, please log into the web site [email or web address] and access using universal [place] password for that web site. If you do not have the password, please call the hospital operator.  12/31/2020, 12:59 PM

## 2020-12-31 NOTE — Plan of Care (Signed)
?  Problem: Elimination: ?Goal: Will not experience complications related to urinary retention ?Outcome: Progressing ?  ?

## 2020-12-31 NOTE — ED Notes (Signed)
Waiting on SLP to come see pt for swallow eval to give medication

## 2020-12-31 NOTE — Progress Notes (Signed)
NEW ADMISSION NOTE New Admission Note:   Arrival Method: bed Mental Orientation:  Alert to self Telemetry: none Assessment: Completed Skin: see assessment IV: not present Pain: denies Tubes: Condom catheter Safety Measures: Safety Fall Prevention Plan has been given, discussed and signed Admission: Completed [facility] Orientation: Patient has been orientated to the room, unit and staff.  Family: not present  Orders have been reviewed and implemented. Will continue to monitor the patient. Call light has been placed within reach and bed alarm has been activated.   Berneta Levins, RN

## 2020-12-31 NOTE — ED Notes (Signed)
Attempted to do swallow eval, pt awake but not alert enough to follow commands.

## 2020-12-31 NOTE — Evaluation (Signed)
Physical Therapy Evaluation and Discharge Patient Details Name: Hector Patel MRN: [id number] DOB: 10/06/1950 Today's Date: 12/31/2020   History of Present Illness  Pt is a 70 y/o male admitted 6/26 secondary to SIRS and unresponsiveness. PMH includes TBI, seizures, CVA, functional quad and dementia.  Clinical Impression  Pt admitted secondary to problem above with deficits below. Pt requiring total A for bed mobility tasks. Pt with no active movement noted in BLE. Pt extremely HOH and only responding "what?" To majority of questions asked. Per notes, pt from SNF for LTC and was previously bed bound. Per notes, plan to return to SNF with hospice services vs hospice facility at d/c. No further skilled PT needs. Will sign off. If needs change, please re-consult.     Follow Up Recommendations Supervision/Assistance - 24 hour;Other (comment) (return to LTC SNF with hospice vs residential hospice per notes)    Equipment Recommendations  None recommended by PT    Recommendations for Other Services       Precautions / Restrictions Precautions Precautions: Fall Restrictions Weight Bearing Restrictions: No      Mobility  Bed Mobility Overal bed mobility: Needs Assistance Bed Mobility: Rolling Rolling: Total assist         General bed mobility comments: Total A to roll for positioning. Pt not attempting to help.    Transfers                    Ambulation/Gait                Stairs            Wheelchair Mobility    Modified Rankin (Stroke Patients Only)       Balance                                             Pertinent Vitals/Pain Pain Assessment: Faces Faces Pain Scale: No hurt    Home Living Family/patient expects to be discharged to:: Skilled nursing facility                 Additional Comments: Likely with hospice services    Prior Function Level of Independence: Needs assistance   Gait / Transfers Assistance  Needed: Per notes has been bed bound. Pt unable to report  ADL's / Homemaking Assistance Needed: assume pt was dependent for ADL tasks        Hand Dominance        Extremity/Trunk Assessment   Upper Extremity Assessment Upper Extremity Assessment: RUE deficits/detail;LUE deficits/detail RUE Deficits / Details: Noted increased tone when moving elbow into flexion. Did not note much functional movement in RUE throughout. LUE Deficits / Details: Functional movement noted in LUE. able to assist with shoulder flexion, elbow flexion, extension, and was able to squeeze PT hand.    Lower Extremity Assessment Lower Extremity Assessment: RLE deficits/detail;LLE deficits/detail RLE Deficits / Details: PROM noted to be [facility], but no active movement noted throughout BLE. LLE Deficits / Details: PROM noted to be [facility], but no active movement noted throughout BLE.       Communication   Communication: Expressive difficulties;HOH (extremely HOH)  Cognition Arousal/Alertness: Awake/alert Behavior During Therapy: Flat affect Overall Cognitive Status: Difficult to assess  General Comments: Pt extremely hard of hearing and unable to hear questions asked. Pt kept repeating "what?" when asked questions. Dementia at baseline.      General Comments General comments (skin integrity, edema, etc.): No family present during session    Exercises     Assessment/Plan    PT Assessment All further PT needs can be met in the next venue of care  PT Problem List Decreased strength;Decreased balance;Decreased activity tolerance;Decreased range of motion;Decreased mobility;Decreased knowledge of use of DME;Decreased knowledge of precautions;Decreased cognition;Decreased coordination;Decreased safety awareness       PT Treatment Interventions      PT Goals (Current goals can be found in the Care Plan section)  Acute Rehab PT Goals PT Goal Formulation: Patient  unable to participate in goal setting Time For Goal Achievement: 12/31/20 Potential to Achieve Goals: Fair    Frequency     Barriers to discharge        Co-evaluation               AM-PAC PT "6 Clicks" Mobility  Outcome Measure Help needed turning from your back to your side while in a flat bed without using bedrails?: Total Help needed moving from lying on your back to sitting on the side of a flat bed without using bedrails?: Total Help needed moving to and from a bed to a chair (including a wheelchair)?: Total Help needed standing up from a chair using your arms (e.g., wheelchair or bedside chair)?: Total Help needed to walk in hospital room?: Total Help needed climbing 3-5 steps with a railing? : Total 6 Click Score: 6    End of Session Equipment Utilized During Treatment: Gait belt Activity Tolerance: Patient tolerated treatment well Patient left: in bed;with call bell/phone within reach (on bed in ED) Nurse Communication: Mobility status;Other (comment) (pt pulled IV out) PT Visit Diagnosis: Other abnormalities of gait and mobility (R26.89);Difficulty in walking, not elsewhere classified (R26.2)    Time: 2423-5361 PT Time Calculation (min) (ACUTE ONLY): 12 min   Charges:   PT Evaluation $PT Eval Moderate Complexity: 1 Mod          Reuel Derby, PT, DPT  Acute Rehabilitation Services  Pager: [phone] Office: [phone]   Rudean Hitt 12/31/2020, 12:56 PM

## 2020-12-31 NOTE — Consult Note (Signed)
WOC Nurse Consult Note: Reason for Consult: Full thickness unstageable pressure injury to coccyx with surrounding nonblanchable,deep red tissue to sacrum  Wound type:pressure Pressure Injury POA: Yes Measurement: coccyx: 0.3 CM wound with 100% slough to wound bed. Surrounding nonblanchable redness to sacrum. Patient is nonresponsive and does not respond to my voice. I am assisted to turn and reposition patient.  Wound bed: slough Drainage (amount, consistency, odor) none noted.  Periwound:DTI to sacrum, intact Dressing procedure/placement/frequency: Cleanse coccyx wound with NS and pat dry. Apply sacral foam dressing.  Offload pressure by turning and repositioning every two hours. Low air loss mattress ordered.  Will not follow at this time.  Please re-consult if needed.  Domenic Moras MSN, RN, FNP-BC CWON Wound, Ostomy, Continence Nurse Pager [phone]
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
## 2020-12-31 NOTE — ED Notes (Signed)
Pt placed in hospital bed for comfort.

## 2021-01-01 DIAGNOSIS — Z515 Encounter for palliative care: Secondary | ICD-10-CM

## 2021-01-01 DIAGNOSIS — N390 Urinary tract infection, site not specified: Secondary | ICD-10-CM | POA: Diagnosis present

## 2021-01-01 DIAGNOSIS — R6520 Severe sepsis without septic shock: Secondary | ICD-10-CM | POA: Diagnosis present

## 2021-01-01 DIAGNOSIS — E669 Obesity, unspecified: Secondary | ICD-10-CM | POA: Diagnosis present

## 2021-01-01 DIAGNOSIS — E870 Hyperosmolality and hypernatremia: Secondary | ICD-10-CM | POA: Diagnosis present

## 2021-01-01 DIAGNOSIS — L89152 Pressure ulcer of sacral region, stage 2: Secondary | ICD-10-CM | POA: Diagnosis present

## 2021-01-01 DIAGNOSIS — Z6832 Body mass index (BMI) 32.0-32.9, adult: Secondary | ICD-10-CM | POA: Diagnosis not present

## 2021-01-01 DIAGNOSIS — E785 Hyperlipidemia, unspecified: Secondary | ICD-10-CM | POA: Diagnosis present

## 2021-01-01 DIAGNOSIS — N179 Acute kidney failure, unspecified: Secondary | ICD-10-CM | POA: Diagnosis present

## 2021-01-01 DIAGNOSIS — I10 Essential (primary) hypertension: Secondary | ICD-10-CM | POA: Diagnosis present

## 2021-01-01 DIAGNOSIS — R410 Disorientation, unspecified: Secondary | ICD-10-CM | POA: Diagnosis present

## 2021-01-01 DIAGNOSIS — R532 Functional quadriplegia: Secondary | ICD-10-CM | POA: Diagnosis present

## 2021-01-01 DIAGNOSIS — Z66 Do not resuscitate: Secondary | ICD-10-CM | POA: Diagnosis present

## 2021-01-01 DIAGNOSIS — I959 Hypotension, unspecified: Secondary | ICD-10-CM | POA: Diagnosis present

## 2021-01-01 DIAGNOSIS — L89150 Pressure ulcer of sacral region, unstageable: Secondary | ICD-10-CM

## 2021-01-01 DIAGNOSIS — R000 Tachycardia, unspecified: Secondary | ICD-10-CM | POA: Diagnosis present

## 2021-01-01 DIAGNOSIS — F0390 Unspecified dementia without behavioral disturbance: Secondary | ICD-10-CM | POA: Diagnosis present

## 2021-01-01 DIAGNOSIS — H9190 Unspecified hearing loss, unspecified ear: Secondary | ICD-10-CM | POA: Diagnosis present

## 2021-01-01 DIAGNOSIS — E119 Type 2 diabetes mellitus without complications: Secondary | ICD-10-CM | POA: Diagnosis present

## 2021-01-01 DIAGNOSIS — E860 Dehydration: Secondary | ICD-10-CM | POA: Diagnosis present

## 2021-01-01 DIAGNOSIS — Z20822 Contact with and (suspected) exposure to covid-19: Secondary | ICD-10-CM | POA: Diagnosis present

## 2021-01-01 DIAGNOSIS — Z87820 Personal history of traumatic brain injury: Secondary | ICD-10-CM | POA: Diagnosis not present

## 2021-01-01 DIAGNOSIS — G40909 Epilepsy, unspecified, not intractable, without status epilepticus: Secondary | ICD-10-CM | POA: Diagnosis present

## 2021-01-01 DIAGNOSIS — G9341 Metabolic encephalopathy: Secondary | ICD-10-CM | POA: Diagnosis present

## 2021-01-01 DIAGNOSIS — A419 Sepsis, unspecified organism: Secondary | ICD-10-CM

## 2021-01-01 DIAGNOSIS — D72829 Elevated white blood cell count, unspecified: Secondary | ICD-10-CM | POA: Diagnosis present

## 2021-01-01 LAB — RESP PANEL BY RT-PCR (FLU A&B, COVID) ARPGX2
Influenza A by PCR: NEGATIVE
Influenza B by PCR: NEGATIVE
SARS Coronavirus 2 by RT PCR: NEGATIVE

## 2021-01-01 LAB — URINE CULTURE

## 2021-01-01 MED ORDER — LORAZEPAM 2 MG/ML PO CONC: 1.0000 mg | ORAL | 0 refills | Status: AC | PRN

## 2021-01-01 MED ORDER — MORPHINE SULFATE (CONCENTRATE) 10 MG /0.5 ML PO SOLN: 10.0000 mg | ORAL | 0 refills | Status: AC | PRN

## 2021-01-01 MED ORDER — GLYCOPYRROLATE 1 MG/5ML PO SOLN: 1.0000 mg | Freq: Four times a day (QID) | ORAL | 0 refills | Status: AC | PRN

## 2021-01-01 MED ORDER — ONDANSETRON 4 MG PO TBDP: 4.0000 mg | ORAL_TABLET | Freq: Four times a day (QID) | ORAL | 0 refills | Status: AC | PRN

## 2021-01-01 NOTE — Progress Notes (Signed)
SLP Cancellation Note  Patient Details Name: Hector Patel MRN: [id number] DOB: Jul 30, 1950   Cancelled treatment:       Reason Eval/Treat Not Completed: Other (comment). Pt transitioned to comfort measures. POs to be given for comfort when/if pt alert. SLP orders partially cancelled. One order remained, will d/c.    Artia Singley, Katherene Ponto 01/01/2021, 7:05 AM

## 2021-01-01 NOTE — TOC Transition Note (Signed)
Transition of Care (TOC) - CM/SW Discharge Note *Discharged back to [facility] SNF   Patient Details  Name: Hector Patel MRN: [id number] Date of Birth: 1951/04/24  Transition of Care [facility]) CM/SW Contact:  Sable Feil, LCSW Phone Number: 01/01/2021, 1:37 PM   Clinical Narrative:  Patient discharging today back to [facility], where he is a long-term care resident. He is now comfort care. Talked with admissions director Claiborne Billings regarding patient's discharging today and no issues with his discharge. COVID test requested.  Talked with patient's sister, Juel Burrow ([phone]) regarding patient's readiness for discharge and she is agreeable. She requested that patient return with hospice services.  Call made to [facility] and spoke with [facility] and referral made for hospice services at [facility] for patient. Mr. Fiorella will be transported to [facility] by non-emergency ambulance.    Final next level of care: [facility]) Barriers to Discharge: Barriers Resolved   Patient Goals and CMS Choice Patient states their goals for this hospitalization and ongoing recovery are:: Patient returning CMS Medicare.gov Compare Post Acute Care list provided to:: Other (Comment Required) (n/a) Choice offered to / list presented to : NA  Discharge Placement              Patient chooses bed at: [facility] (Mr. Rapozo is LTC resident at faciity) Patient to be transferred to facility by: Non-emergency ambulance transport Name of family member notified: Sister Juel Burrow [phone]) Patient and family notified of of transfer: 01/01/21  Discharge Plan and Services In-house Referral: Clinical Social Work   Post Acute Care Choice: Nursing Home ([place])                               Social Determinants of Health (SDOH) Interventions     Readmission Risk Interventions No flowsheet data found.

## 2021-01-01 NOTE — NC FL2 (Signed)
[place] LEVEL OF CARE SCREENING TOOL     IDENTIFICATION  Patient Name: Hector Patel Birthdate: 11-Apr-1951 Sex: male Admission Date (Current Location): 12/30/2020  [facility] and [place] Number:  Herbalist and Address:  [facility] [place] [address] [place], [place] [address]      Provider Number:    Attending Physician Name and Address:  Mercy Riding, MD  Relative Name and Phone Number:  Daiva Huge - sister; [phone]    Current Level of Care: Hospital Recommended Level of Care: [facility]) Prior Approval Number:    Date Approved/Denied:   PASRR Number:    Discharge Plan: SNF (Nursing facility - LTC)    Current Diagnoses: Patient Active Problem List   Diagnosis Date Noted   Admission for end of life care 01/01/2021   SIRS (systemic inflammatory response syndrome) ([place]) 12/30/2020   Dehydration 12/30/2020   Acute lower UTI 12/30/2020   Stroke (cerebrum) ([place]) 12/30/2020   AKI (acute kidney injury) ([place]) 12/30/2020   Functional quadriplegia ([place]) 12/30/2020   Sacral decubitus ulcer 15/40/0867   Acute metabolic encephalopathy 61/95/0932   TBI (traumatic brain injury) ([place]) 03/04/2018   HCAP (healthcare-associated pneumonia) 03/04/2018   Sepsis ([place]) 03/04/2018   Seizure disorder ([place]) 03/04/2018   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[place]) 11/08/2017    Orientation RESPIRATION BLADDER Height & Weight        Normal Incontinent, External catheter (condom cath placed 6/27) Weight: 238 lb 1.6 oz (108 kg) Height:  6' (182.9 cm)  BEHAVIORAL SYMPTOMS/MOOD NEUROLOGICAL BOWEL NUTRITION STATUS      Continent Diet (Regular diet)  AMBULATORY STATUS COMMUNICATION OF NEEDS Skin   Total Care   Other (Comment) (Unstageable pressure injury to medial sacrum)                       Personal Care Assistance Level of Assistance  Bathing,  Feeding, Dressing Bathing Assistance: Maximum assistance Feeding assistance: Maximum assistance Dressing Assistance: Maximum assistance     Functional Limitations Info  Sight, Hearing, Speech Sight Info: Adequate Hearing Info: Adequate Speech Info: Adequate    SPECIAL CARE FACTORS FREQUENCY  PT (By licensed PT)     PT Frequency: Evaluated 6/27              Contractures Contractures Info: Not present    Additional Factors Info  Code Status, Allergies Code Status Info: DNR Allergies Info: No known allergies           Current Medications (01/01/2021):  This is the current hospital active medication list Current Facility-Administered Medications  Medication Dose Route Frequency Provider Last Rate Last Admin   acetaminophen (TYLENOL) tablet 650 mg  650 mg Oral Q6H PRN Toy Baker, MD       Or   acetaminophen (TYLENOL) suppository 650 mg  650 mg Rectal Q6H PRN Doutova, Anastassia, MD       antiseptic oral rinse (BIOTENE) solution 15 mL  15 mL Topical PRN Earlie Counts, NP       busPIRone (BUSPAR) tablet 10 mg  10 mg Oral TID Toy Baker, MD       docusate sodium (COLACE) capsule 100 mg  100 mg Oral BID Doutova, Anastassia, MD       glycopyrrolate (ROBINUL) tablet 1 mg  1 mg Oral Q4H PRN Earlie Counts, NP       Or   glycopyrrolate (  ROBINUL) injection 0.2 mg  0.2 mg Subcutaneous Q4H PRN Earlie Counts, NP       Or   glycopyrrolate (ROBINUL) injection 0.2 mg  0.2 mg Intravenous Q4H PRN Earlie Counts, NP       haloperidol (HALDOL) tablet 0.5 mg  0.5 mg Oral Q4H PRN Earlie Counts, NP       Or   haloperidol (HALDOL) 2 MG/ML solution 0.5 mg  0.5 mg Sublingual Q4H PRN Earlie Counts, NP       Or   haloperidol lactate (HALDOL) injection 0.5 mg  0.5 mg Intravenous Q4H PRN Earlie Counts, NP       haloperidol lactate (HALDOL) injection 5 mg  5 mg Intramuscular Q6H PRN Imagene Sheller S, DO       levETIRAcetam (KEPPRA) IVPB 1500 mg/ 100 mL premix  1,500 mg  Intravenous Q12H Toy Baker, MD 400 mL/hr at 01/01/21 1012 1,500 mg at 01/01/21 1012   LORazepam (ATIVAN) tablet 1 mg  1 mg Oral Q4H PRN Earlie Counts, NP       Or   LORazepam (ATIVAN) 2 MG/ML concentrated solution 1 mg  1 mg Sublingual Q4H PRN Earlie Counts, NP       Or   LORazepam (ATIVAN) injection 1 mg  1 mg Intravenous Q4H PRN Earlie Counts, NP   1 mg at 12/31/20 2106   morphine 2 MG/ML injection 1 mg  1 mg Intravenous Q2H PRN Earlie Counts, NP       ondansetron (ZOFRAN-ODT) disintegrating tablet 4 mg  4 mg Oral Q6H PRN Earlie Counts, NP       Or   ondansetron (ZOFRAN) injection 4 mg  4 mg Intravenous Q6H PRN Earlie Counts, NP       phenytoin (DILANTIN) injection 100 mg  100 mg Intravenous Q8H Doutova, Anastassia, MD   100 mg at 01/01/21 0535   polyethylene glycol (MIRALAX / GLYCOLAX) packet 17 g  17 g Oral Daily Doutova, Anastassia, MD       polyvinyl alcohol (LIQUIFILM TEARS) 1.4 % ophthalmic solution 1 drop  1 drop Both Eyes QID PRN Earlie Counts, NP       QUEtiapine (SEROQUEL XR) 24 hr tablet 50 mg  50 mg Oral Daily Doutova, Anastassia, MD       QUEtiapine (SEROQUEL) tablet 25 mg  25 mg Oral QHS Toy Baker, MD         Discharge Medications: Please see discharge summary for a list of discharge medications.  Relevant Imaging Results:  Relevant Lab Results:   Additional Information ss#[id number]  Sable Feil, LCSW
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
## 2021-01-01 NOTE — Progress Notes (Signed)
Held all po meds. Patient is lethargic.

## 2021-01-01 NOTE — Plan of Care (Signed)
  Problem: Nutrition: Goal: Adequate nutrition will be maintained Outcome: Adequate for Discharge   Problem: Elimination: Goal: Will not experience complications related to bowel motility Outcome: Adequate for Discharge   Problem: Pain Managment: Goal: General experience of comfort will improve Outcome: Adequate for Discharge   Problem: Coping: Goal: Ability to identify and develop effective coping behavior will improve Outcome: Adequate for Discharge   Problem: Clinical Measurements: Goal: Quality of life will improve Outcome: Adequate for Discharge   Problem: Respiratory: Goal: Verbalizations of increased ease of respirations will increase Outcome: Adequate for Discharge   Problem: Role Relationship: Goal: Family's ability to cope with current situation will improve Outcome: Adequate for Discharge Goal: Ability to verbalize concerns, feelings, and thoughts to partner or family member will improve Outcome: Adequate for Discharge   Problem: Pain Management: Goal: Satisfaction with pain management regimen will improve Outcome: Adequate for Discharge

## 2021-01-01 NOTE — Plan of Care (Signed)
  Problem: Elimination: Goal: Will not experience complications related to urinary retention Outcome: Completed/Met   Problem: Education: Goal: Knowledge of the prescribed therapeutic regimen will improve Outcome: Completed/Met

## 2021-01-01 NOTE — Progress Notes (Signed)
Manufacturing engineer [facility]) Hospital Liaison: RN note    Notified by Transition of Care Manger of patient/family request for [facility] services at [facility] after discharge. Chart and patient information under review by [facility] physician. Hospice eligibility pending currently.    Writer spoke with sister to initiate education related to hospice philosophy, services and team approach to care. Family verbalized understanding of information given.    Please send signed and completed DNR form home with patient/family. Patient will need prescriptions for discharge comfort medications.     [facility] aware of the above. Please notify [facility] when patient is ready to leave the unit at discharge. (Call [phone] or [phone] after 5pm.) [facility] information and contact numbers given to family.      A Please do not hesitate to call with questions.    Thank you,   Farrel Gordon, RN, [place] (listed on [facility] under [place])    [phone]
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
## 2021-01-01 NOTE — Progress Notes (Signed)
DISCHARGE NOTE [facility] to be discharged [facility].  per MD order. Patient verbalized understanding.  Skin clean, dry and intact without evidence of skin break down, no evidence of skin tears noted. IV catheter discontinued intact. Site without signs and symptoms of complications. Dressing and pressure applied. Pt denies pain at the site currently. No complaints noted.  Patient free of lines, drains, and wounds.   Discharge packet assembled. An After Visit Summary (AVS) was printed and given to the EMS personnel. Patient escorted via stretcher and discharged to [facility] via ambulance. Report called to accepting facility; all questions and concerns addressed.   Viviano Simas, RN

## 2021-01-04 LAB — CULTURE, BLOOD (SINGLE)
Culture: NO GROWTH
Special Requests: ADEQUATE

## 2021-03-09 IMAGING — CT CT HEAD W/O CM
3 series · 14 of 47 positions shown, 16 images · non-contrast
Comparison: Head CT 03/04/2018

CLINICAL DATA: Posttraumatic headache

EXAM:
CT HEAD WITHOUT CONTRAST
CT MAXILLOFACIAL WITHOUT CONTRAST
CT CERVICAL SPINE WITHOUT CONTRAST
TECHNIQUE: Multidetector CT imaging of the head, cervical spine, and
maxillofacial structures were performed using the standard protocol
without intravenous contrast. Multiplanar CT image reconstructions
of the cervical spine and maxillofacial structures were also
generated.

[Series 3: head wo · axial · 0.46mm/px · z∈[-158,-18]mm · 8 of 34 slices shown, 10 images]
[im 3/34  brain]
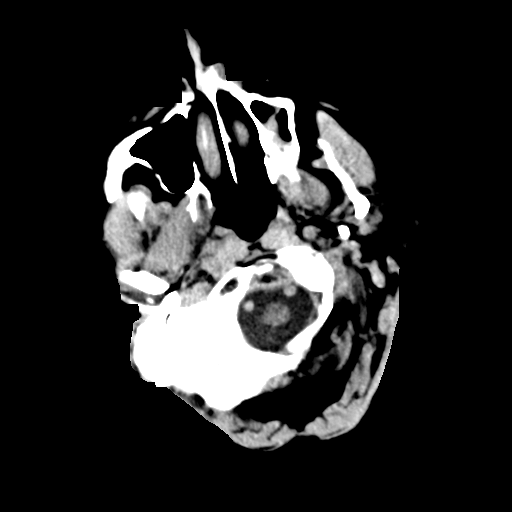
[im 3/34  bone]
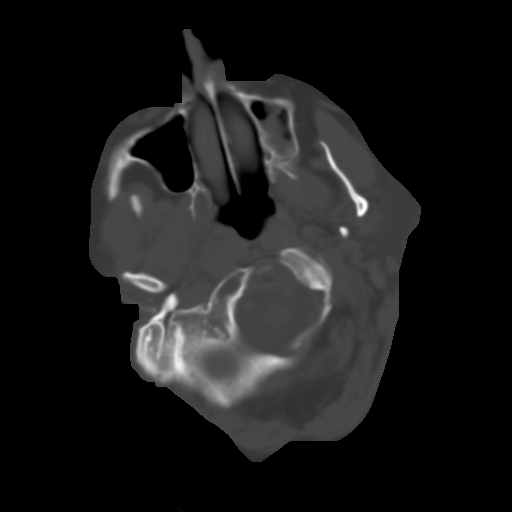
[im 7/34  brain]
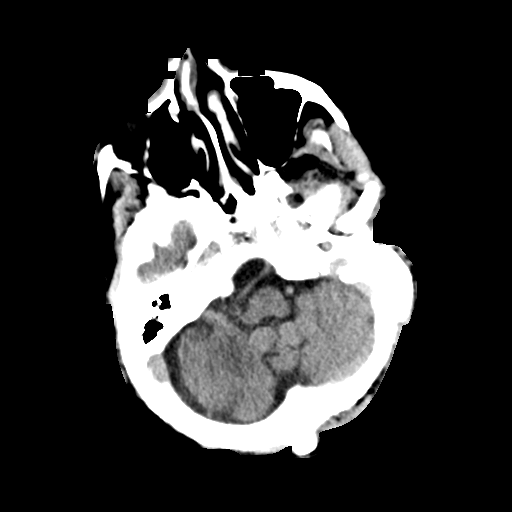
[im 11/34  brain]
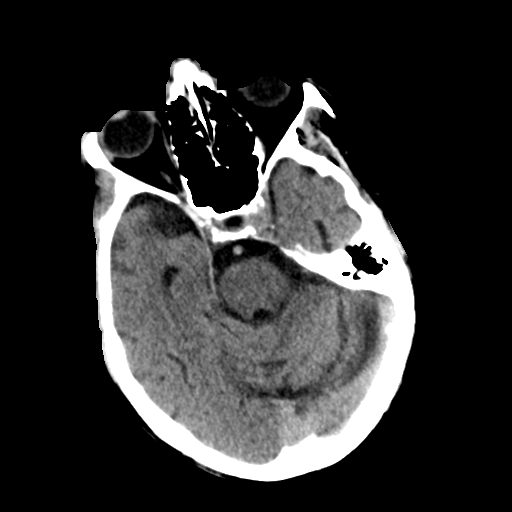
[im 15/34  brain]
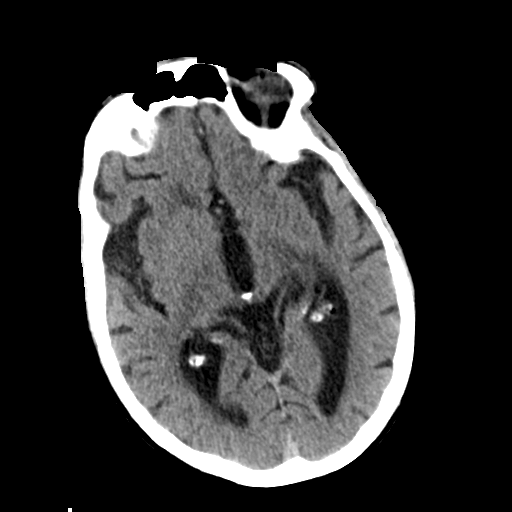
[im 19/34  brain]
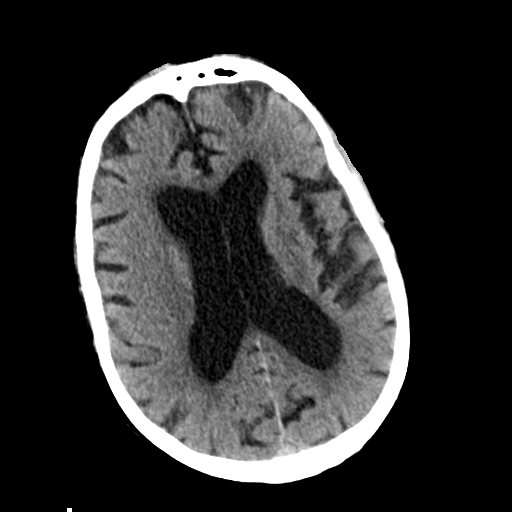
[im 19/34  bone]
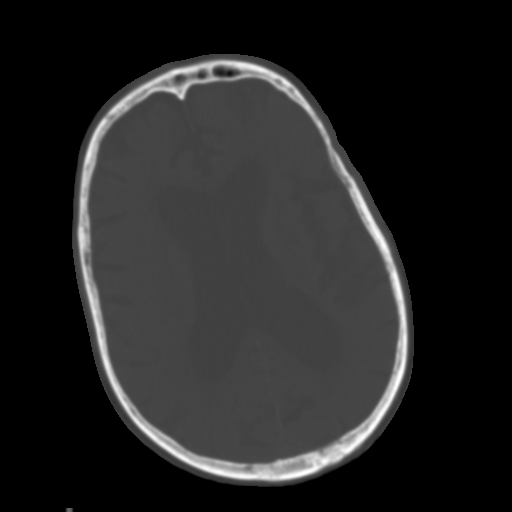
[im 23/34  brain]
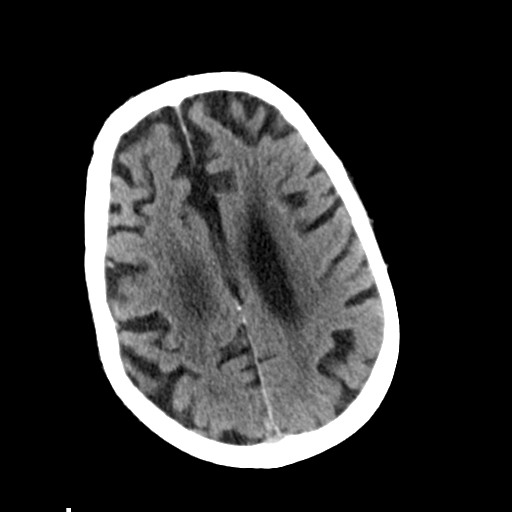
[im 27/34  brain]
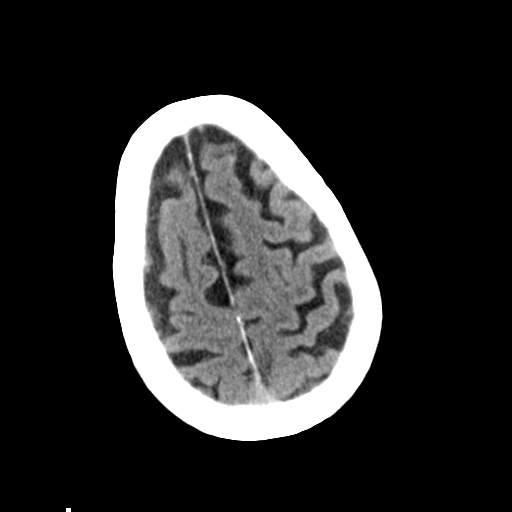
[im 31/34  brain]
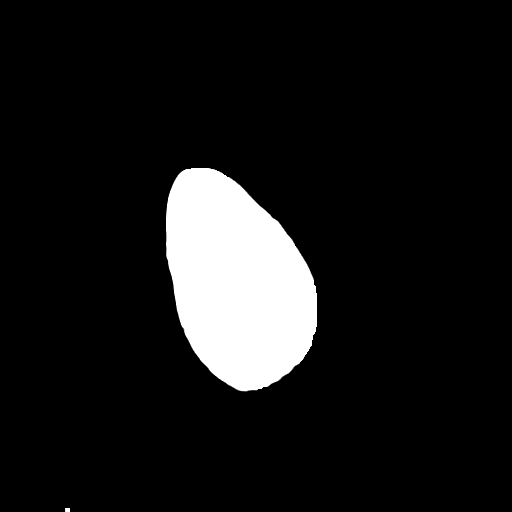

[Series 6: coronal soft tissue · coronal · 0.34mm/px · 3 of 76 slices shown]
[im 26/76  brain]
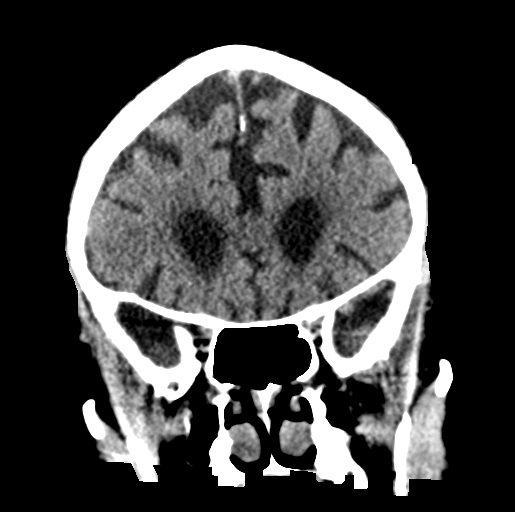
[im 34/76  brain]
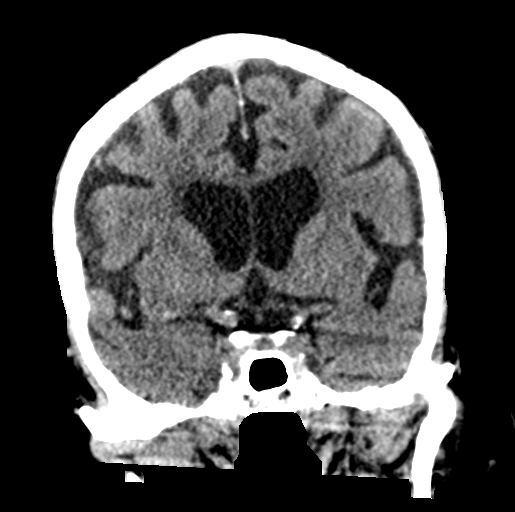
[im 42/76  brain]
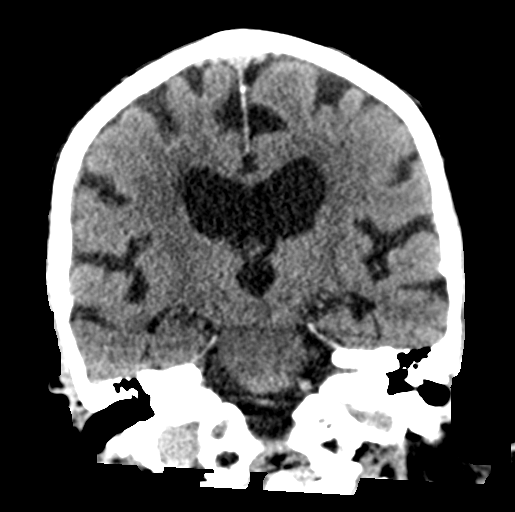

[Series 7: sagittal soft tissue · sagittal · 0.34mm/px · 3 of 56 slices shown]
[im 19/56  brain]
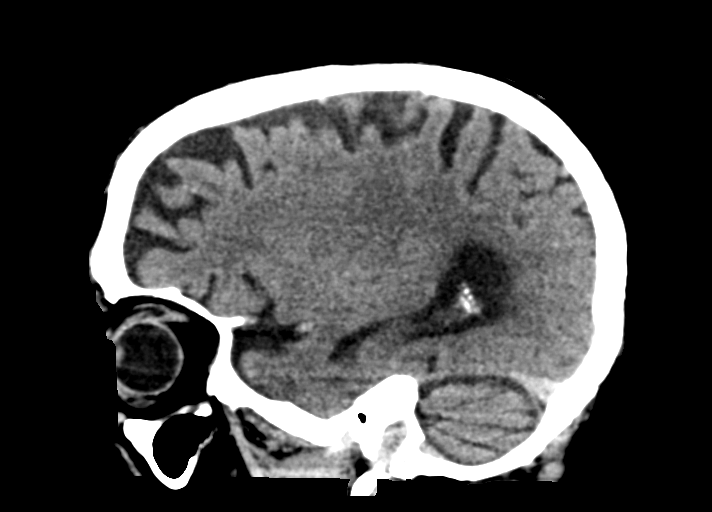
[im 28/56  brain]
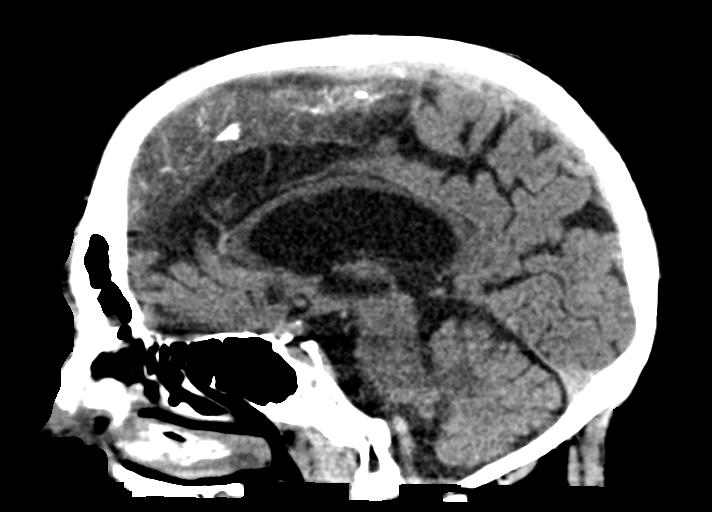
[im 37/56  brain]
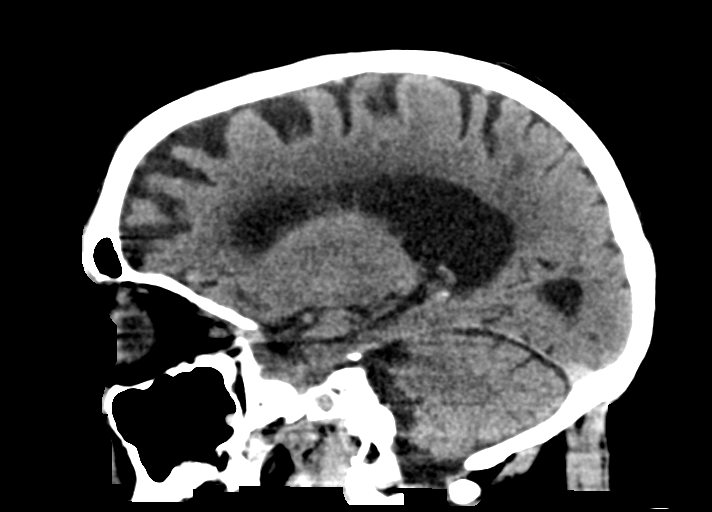

[14 of 47 positions shown; findings below may reference images not displayed]

FINDINGS: CT HEAD FINDINGS

Brain: No evidence of acute infarction, hemorrhage, hydrocephalus,
extra-axial collection or mass lesion/mass effect. Prominent
generalized atrophy. Mild small vessel ischemic type change in the
deep cerebral white matter.

Vascular: Atherosclerotic calcification

Skull: Forehead hematoma.  Reference face CT

CT MAXILLOFACIAL FINDINGS

Osseous: Acute bilateral nasal arch fracture with minimal depression
on the right. There is prominent rightward nasal septal deviation
without visible septal fracture. Lucency across the inferior nasal
aperture is from vascular channel based on reformats.

Intact and located mandible.

Orbits: Negative

Sinuses: Presumed retention cyst in the inferior left maxillary
sinus. Negative for hemosinus.

Soft tissues: Soft tissue swelling over the nose and central
forehead.

CT CERVICAL SPINE FINDINGS

Alignment: Normal

Skull base and vertebrae: C5 spinous process fracture extending into
the lamina the left, nondisplaced.

C6 right spinous process fracture without displacement along the
transverse foramen.

Patient is already in a collar.

Soft tissues and spinal canal: No prevertebral or visible visible
canal hematoma.

Disc levels: Lower cervical disc degeneration with narrowing and
ridging causing foraminal stenosis at C5-6 and C6-7. No visible cord
impingement.

Upper chest: Negative
IMPRESSION: Cervical spine CT:

1. C5 spinous process and left lamina fracture, nondisplaced. No
listhesis.
2. C6 right transverse process avulsion fracture.

Face CT:

Acute bilateral nasal arch fracture.

Head CT:

1. No evidence of intracranial injury.
2. Prominent atrophy.
